# Patient Record
Sex: Female | Born: 1955
Health system: Southern US, Community
[De-identification: ages and names within clinical notes are randomized; demographics above are authoritative.]

## PROBLEM LIST (undated history)

## (undated) DIAGNOSIS — F419 Anxiety disorder, unspecified: Secondary | ICD-10-CM

## (undated) DIAGNOSIS — F410 Panic disorder [episodic paroxysmal anxiety] without agoraphobia: Secondary | ICD-10-CM

## (undated) DIAGNOSIS — E785 Hyperlipidemia, unspecified: Secondary | ICD-10-CM

## (undated) DIAGNOSIS — K219 Gastro-esophageal reflux disease without esophagitis: Secondary | ICD-10-CM

## (undated) DIAGNOSIS — B019 Varicella without complication: Secondary | ICD-10-CM

## (undated) HISTORY — DX: Varicella without complication: B01.9

## (undated) HISTORY — DX: Gastro-esophageal reflux disease without esophagitis: K21.9

## (undated) HISTORY — PX: WISDOM TOOTH EXTRACTION: SHX21

## (undated) HISTORY — PX: TONSILLECTOMY: SUR1361

## (undated) HISTORY — PX: APPENDECTOMY: SHX54

## (undated) HISTORY — DX: Panic disorder (episodic paroxysmal anxiety): F41.0

## (undated) HISTORY — DX: Hyperlipidemia, unspecified: E78.5

---

## 1998-08-10 ENCOUNTER — Encounter: Payer: Self-pay | Admitting: Oral and Maxillofacial Surgery

## 1998-08-12 ENCOUNTER — Observation Stay (HOSPITAL_COMMUNITY): Admission: RE | Admit: 1998-08-12 | Discharge: 1998-08-13 | Payer: Self-pay | Admitting: Oral and Maxillofacial Surgery

## 1999-12-21 ENCOUNTER — Other Ambulatory Visit: Admission: RE | Admit: 1999-12-21 | Discharge: 1999-12-21 | Payer: Self-pay | Admitting: Obstetrics and Gynecology

## 2001-04-06 ENCOUNTER — Other Ambulatory Visit: Admission: RE | Admit: 2001-04-06 | Discharge: 2001-04-06 | Payer: Self-pay | Admitting: Obstetrics and Gynecology

## 2004-06-07 ENCOUNTER — Other Ambulatory Visit: Admission: RE | Admit: 2004-06-07 | Discharge: 2004-06-07 | Payer: Self-pay | Admitting: *Deleted

## 2006-04-10 ENCOUNTER — Other Ambulatory Visit: Admission: RE | Admit: 2006-04-10 | Discharge: 2006-04-10 | Payer: Self-pay | Admitting: Obstetrics and Gynecology

## 2007-06-13 ENCOUNTER — Other Ambulatory Visit: Admission: RE | Admit: 2007-06-13 | Discharge: 2007-06-13 | Payer: Self-pay | Admitting: Obstetrics & Gynecology

## 2009-05-03 DIAGNOSIS — G2581 Restless legs syndrome: Secondary | ICD-10-CM | POA: Insufficient documentation

## 2012-06-25 LAB — HM DEXA SCAN: HM Dexa Scan: NORMAL

## 2012-06-25 LAB — HM PAP SMEAR: HM Pap smear: NEGATIVE

## 2012-06-25 LAB — HM MAMMOGRAPHY: HM Mammogram: NEGATIVE

## 2013-07-01 ENCOUNTER — Ambulatory Visit: Payer: Self-pay | Admitting: Nurse Practitioner

## 2013-08-13 ENCOUNTER — Ambulatory Visit: Payer: Self-pay | Admitting: Nurse Practitioner

## 2013-09-19 ENCOUNTER — Encounter: Payer: Self-pay | Admitting: Nurse Practitioner

## 2013-09-20 ENCOUNTER — Ambulatory Visit (INDEPENDENT_AMBULATORY_CARE_PROVIDER_SITE_OTHER): Payer: BC Managed Care – PPO | Admitting: Nurse Practitioner

## 2013-09-20 ENCOUNTER — Encounter: Payer: Self-pay | Admitting: Nurse Practitioner

## 2013-09-20 VITALS — BP 108/64 | HR 80 | Ht 63.75 in | Wt 158.0 lb

## 2013-09-20 DIAGNOSIS — F4001 Agoraphobia with panic disorder: Secondary | ICD-10-CM

## 2013-09-20 DIAGNOSIS — Z0000 Encounter for general adult medical examination without abnormal findings: Secondary | ICD-10-CM

## 2013-09-20 DIAGNOSIS — Z01419 Encounter for gynecological examination (general) (routine) without abnormal findings: Secondary | ICD-10-CM

## 2013-09-20 DIAGNOSIS — F410 Panic disorder [episodic paroxysmal anxiety] without agoraphobia: Secondary | ICD-10-CM | POA: Insufficient documentation

## 2013-09-20 DIAGNOSIS — Z1211 Encounter for screening for malignant neoplasm of colon: Secondary | ICD-10-CM

## 2013-09-20 DIAGNOSIS — Z23 Encounter for immunization: Secondary | ICD-10-CM

## 2013-09-20 LAB — POCT URINALYSIS DIPSTICK
Bilirubin, UA: NEGATIVE
Blood, UA: NEGATIVE
Glucose, UA: NEGATIVE
Ketones, UA: NEGATIVE
Leukocytes, UA: NEGATIVE
Nitrite, UA: NEGATIVE
Protein, UA: NEGATIVE
Urobilinogen, UA: NEGATIVE
pH, UA: 7

## 2013-09-20 NOTE — Progress Notes (Signed)
Patient ID: Deanna Peters, female   DOB: Aug 08, 1955, 58 y.o.   MRN: [id number] 58 y.o. G43P2002 Married Caucasian Fe here for annual exam.  Feels well. No pain with SA.  No new health concerns.  No vaso symptoms.  Patient's last menstrual period was 11/29/2005.          Sexually active: yes  The current method of family planning is post menopausal status.    Exercising: yes  Gym/ health club routine includes cardio, low impact aerobics, yoga and plays tennis. Smoker:  no  Health Maintenance: Pap:  06/25/12, WNL, neg HR HPV MMG:  06/25/12, Bi-Rads 1: negative Colonoscopy:  never BMD:  06/25/12, -1.0/-0.7R/-0.6R TDaP:  UTD Labs:  PCP Urine: negative    reports that she has never smoked. She has never used smokeless tobacco. She reports that she drinks alcohol. She reports that she does not use illicit drugs.  Past Medical History  Diagnosis Date  . Hyperlipidemia   . Agoraphobia with panic disorder     Past Surgical History  Procedure Laterality Date  . Wisdom tooth extraction  age 58    Current Outpatient Prescriptions  Medication Sig Dispense Refill  . buPROPion (WELLBUTRIN XL) 300 MG 24 hr tablet Take 300 mg by mouth daily.      . sertraline (ZOLOFT) 50 MG tablet Take 75 mg by mouth daily.       No current facility-administered medications for this visit.    Family History  Problem Relation Age of Onset  . Hyperlipidemia Mother   . Heart disease Father   . Heart disease Paternal Grandfather     ROS:  Pertinent items are noted in HPI.  Otherwise, a comprehensive ROS was negative.  Exam:   BP 108/64  Pulse 80  Ht 5' 3.75" (1.619 m)  Wt 158 lb (71.668 kg)  BMI 27.34 kg/m2  LMP 11/29/2005 Height: 5' 3.75" (161.9 cm)  Ht Readings from Last 3 Encounters:  09/20/13 5' 3.75" (1.619 m)    General appearance: alert, cooperative and appears stated age Head: Normocephalic, without obvious abnormality, atraumatic Neck: no adenopathy, supple, symmetrical, trachea midline  and thyroid normal to inspection and palpation Lungs: clear to auscultation bilaterally Breasts: normal appearance, no masses or tenderness Heart: regular rate and rhythm Abdomen: soft, non-tender; no masses,  no organomegaly Extremities: extremities normal, atraumatic, no cyanosis or edema Skin: Skin color, texture, turgor normal. No rashes or lesions Lymph nodes: Cervical, supraclavicular, and axillary nodes normal. No abnormal inguinal nodes palpated Neurologic: Grossly normal   Pelvic: External genitalia:  no lesions              Urethra:  normal appearing urethra with no masses, tenderness or lesions              Bartholin's and Skene's: normal                 Vagina: normal appearing vagina with normal color and discharge, no lesions              Cervix: anteverted              Pap taken: no Bimanual Exam:  Uterus:  normal size, contour, position, consistency, mobility, non-tender              Adnexa: no mass, fullness, tenderness               Rectovaginal: Confirms               Anus:  normal sphincter tone, no lesions  A:  Well Woman with normal exam  Postmenopausal - no HRT  History of high cholesterol and is seeing nutritionist  Immunization  Update  History of Agoraphobia/ panic disorder  P:   Pap smear as per guidelines   Mammogram due now and will schedule  TDaP given today  Will return in about 3 months for repeat labs  If elevated she will need to see PCP - since she does not have one, made recommendations for her.  She has finally decided on doing colonoscopy and referral made to Dr. Loreta AveMann.  Counseled on breast self exam, mammography screening, adequate intake of calcium and vitamin D, diet and exercise, Kegel's exercises return annually or prn  An After Visit Summary was printed and given to the patient.

## 2013-09-20 NOTE — Patient Instructions (Addendum)

## 2013-09-22 NOTE — Progress Notes (Signed)
Encounter reviewed by Dr. Brook Silva.  

## 2013-09-23 ENCOUNTER — Other Ambulatory Visit: Payer: Self-pay | Admitting: Nurse Practitioner

## 2013-09-23 DIAGNOSIS — Z7689 Persons encountering health services in other specified circumstances: Secondary | ICD-10-CM

## 2013-10-03 ENCOUNTER — Telehealth: Payer: Self-pay | Admitting: Nurse Practitioner

## 2013-10-03 NOTE — Telephone Encounter (Signed)
Home # rings busy Cell phone rings, but there is no voicemail option available. Patient is scheduled with Dr Loreta AveMann 03.24.2015 @ 215. [facility]

## 2013-10-30 ENCOUNTER — Emergency Department (HOSPITAL_COMMUNITY): Payer: BC Managed Care – PPO

## 2013-10-30 ENCOUNTER — Emergency Department (HOSPITAL_COMMUNITY): Payer: BC Managed Care – PPO | Admitting: Anesthesiology

## 2013-10-30 ENCOUNTER — Encounter (HOSPITAL_COMMUNITY)
Admission: EM | Disposition: A | Discharge status: Home / Self Care | Payer: Self-pay | Source: Home / Self Care | Attending: Emergency Medicine

## 2013-10-30 ENCOUNTER — Encounter (HOSPITAL_COMMUNITY): Payer: Self-pay | Admitting: Emergency Medicine

## 2013-10-30 ENCOUNTER — Observation Stay (HOSPITAL_COMMUNITY)
Admission: EM | Admit: 2013-10-30 | Discharge: 2013-10-31 | Disposition: A | Discharge status: Home / Self Care | Payer: BC Managed Care – PPO | Attending: General Surgery | Admitting: General Surgery

## 2013-10-30 ENCOUNTER — Encounter (HOSPITAL_COMMUNITY): Payer: BC Managed Care – PPO | Admitting: Anesthesiology

## 2013-10-30 DIAGNOSIS — K37 Unspecified appendicitis: Secondary | ICD-10-CM

## 2013-10-30 DIAGNOSIS — K3580 Unspecified acute appendicitis: Secondary | ICD-10-CM

## 2013-10-30 DIAGNOSIS — F410 Panic disorder [episodic paroxysmal anxiety] without agoraphobia: Secondary | ICD-10-CM | POA: Insufficient documentation

## 2013-10-30 DIAGNOSIS — K8020 Calculus of gallbladder without cholecystitis without obstruction: Secondary | ICD-10-CM | POA: Insufficient documentation

## 2013-10-30 DIAGNOSIS — E785 Hyperlipidemia, unspecified: Secondary | ICD-10-CM | POA: Insufficient documentation

## 2013-10-30 DIAGNOSIS — R1031 Right lower quadrant pain: Secondary | ICD-10-CM | POA: Insufficient documentation

## 2013-10-30 DIAGNOSIS — R109 Unspecified abdominal pain: Secondary | ICD-10-CM

## 2013-10-30 DIAGNOSIS — D72829 Elevated white blood cell count, unspecified: Secondary | ICD-10-CM | POA: Insufficient documentation

## 2013-10-30 HISTORY — PX: LAPAROSCOPIC APPENDECTOMY: SHX408

## 2013-10-30 LAB — COMPREHENSIVE METABOLIC PANEL
ALBUMIN: 4.3 g/dL (ref 3.5–5.2)
ALT: 23 U/L (ref 0–35)
AST: 17 U/L (ref 0–37)
Alkaline Phosphatase: 85 U/L (ref 39–117)
BUN: 12 mg/dL (ref 6–23)
CO2: 26 mEq/L (ref 19–32)
CREATININE: 0.87 mg/dL (ref 0.50–1.10)
Calcium: 10.4 mg/dL (ref 8.4–10.5)
Chloride: 99 mEq/L (ref 96–112)
GFR calc Af Amer: 83 mL/min — ABNORMAL LOW (ref >90)
GFR calc non Af Amer: 72 mL/min — ABNORMAL LOW (ref >90)
Glucose, Bld: 126 mg/dL — ABNORMAL HIGH (ref 70–99)
Potassium: 3.9 mEq/L (ref 3.7–5.3)
Sodium: 137 mEq/L (ref 137–147)
Total Bilirubin: 0.9 mg/dL (ref 0.3–1.2)
Total Protein: 8.1 g/dL (ref 6.0–8.3)

## 2013-10-30 LAB — URINALYSIS, ROUTINE W REFLEX MICROSCOPIC
GLUCOSE, UA: NEGATIVE mg/dL
Ketones, ur: NEGATIVE mg/dL
Nitrite: NEGATIVE
Protein, ur: NEGATIVE mg/dL
Specific Gravity, Urine: 1.03 (ref 1.005–1.030)
Urobilinogen, UA: 0.2 mg/dL (ref 0.0–1.0)
pH: 6 (ref 5.0–8.0)

## 2013-10-30 LAB — CBC WITH DIFFERENTIAL/PLATELET
BASOS PCT: 0 % (ref 0–1)
Basophils Absolute: 0 10*3/uL (ref 0.0–0.1)
EOS PCT: 0 % (ref 0–5)
Eosinophils Absolute: 0 10*3/uL (ref 0.0–0.7)
HEMATOCRIT: 45.2 % (ref 36.0–46.0)
Hemoglobin: 15.6 g/dL — ABNORMAL HIGH (ref 12.0–15.0)
Lymphocytes Relative: 9 % — ABNORMAL LOW (ref 12–46)
Lymphs Abs: 1.3 10*3/uL (ref 0.7–4.0)
MCH: 30.8 pg (ref 26.0–34.0)
MCHC: 34.5 g/dL (ref 30.0–36.0)
MCV: 89.2 fL (ref 78.0–100.0)
MONO ABS: 1.2 10*3/uL — AB (ref 0.1–1.0)
Monocytes Relative: 8 % (ref 3–12)
Neutro Abs: 12.6 10*3/uL — ABNORMAL HIGH (ref 1.7–7.7)
Neutrophils Relative %: 83 % — ABNORMAL HIGH (ref 43–77)
Platelets: 254 10*3/uL (ref 150–400)
RBC: 5.07 MIL/uL (ref 3.87–5.11)
RDW: 12.7 % (ref 11.5–15.5)
WBC: 15.2 10*3/uL — ABNORMAL HIGH (ref 4.0–10.5)

## 2013-10-30 LAB — URINE MICROSCOPIC-ADD ON

## 2013-10-30 SURGERY — APPENDECTOMY, LAPAROSCOPIC
Anesthesia: General | Site: Abdomen

## 2013-10-30 MED ORDER — IOHEXOL 300 MG/ML  SOLN
25.0000 mL | Freq: Once | INTRAMUSCULAR | Status: AC | PRN
  Administered 2013-10-30: 25 mL via ORAL

## 2013-10-30 MED ORDER — SERTRALINE HCL 50 MG PO TABS
75.0000 mg | ORAL_TABLET | Freq: Every day | ORAL | Status: DC
  Administered 2013-10-31: 75 mg via ORAL
  Filled 2013-10-30: qty 1

## 2013-10-30 MED ORDER — ROCURONIUM BROMIDE 100 MG/10ML IV SOLN
INTRAVENOUS | Status: AC
Start: 2013-10-30 — End: 2013-10-30
  Filled 2013-10-30: qty 1

## 2013-10-30 MED ORDER — LIDOCAINE HCL (CARDIAC) 20 MG/ML IV SOLN
INTRAVENOUS | Status: AC
  Filled 2013-10-30: qty 5

## 2013-10-30 MED ORDER — HYDROMORPHONE HCL PF 1 MG/ML IJ SOLN: 0.5000 mg | INTRAMUSCULAR | Status: DC | PRN

## 2013-10-30 MED ORDER — GLYCOPYRROLATE 0.2 MG/ML IJ SOLN
INTRAMUSCULAR | Status: DC | PRN
  Administered 2013-10-30: 0.4 mg via INTRAVENOUS

## 2013-10-30 MED ORDER — ONDANSETRON HCL 4 MG/2ML IJ SOLN: 4.0000 mg | Freq: Four times a day (QID) | INTRAMUSCULAR | Status: DC | PRN

## 2013-10-30 MED ORDER — PROPOFOL 10 MG/ML IV BOLUS
INTRAVENOUS | Status: DC | PRN
  Administered 2013-10-30: 150 mg via INTRAVENOUS

## 2013-10-30 MED ORDER — ONDANSETRON HCL 4 MG/2ML IJ SOLN
INTRAMUSCULAR | Status: DC | PRN
  Administered 2013-10-30: 4 mg via INTRAVENOUS

## 2013-10-30 MED ORDER — FENTANYL CITRATE 0.05 MG/ML IJ SOLN
INTRAMUSCULAR | Status: AC
  Filled 2013-10-30: qty 5

## 2013-10-30 MED ORDER — KETOROLAC TROMETHAMINE 30 MG/ML IJ SOLN
INTRAMUSCULAR | Status: AC
  Filled 2013-10-30: qty 1

## 2013-10-30 MED ORDER — ONDANSETRON HCL 4 MG/2ML IJ SOLN
4.0000 mg | Freq: Once | INTRAMUSCULAR | Status: AC
  Administered 2013-10-30: 4 mg via INTRAVENOUS
  Filled 2013-10-30: qty 2

## 2013-10-30 MED ORDER — BUPIVACAINE-EPINEPHRINE PF 0.5-1:200000 % IJ SOLN
INTRAMUSCULAR | Status: AC
  Filled 2013-10-30: qty 30

## 2013-10-30 MED ORDER — FENTANYL CITRATE 0.05 MG/ML IJ SOLN
INTRAMUSCULAR | Status: DC | PRN
  Administered 2013-10-30 (×3): 50 ug via INTRAVENOUS
  Administered 2013-10-30: 100 ug via INTRAVENOUS

## 2013-10-30 MED ORDER — LACTATED RINGERS IV SOLN: INTRAVENOUS | Status: DC

## 2013-10-30 MED ORDER — HEPARIN SODIUM (PORCINE) 5000 UNIT/ML IJ SOLN
5000.0000 [IU] | Freq: Three times a day (TID) | INTRAMUSCULAR | Status: DC
  Administered 2013-10-30 – 2013-10-31 (×2): 5000 [IU] via SUBCUTANEOUS
  Filled 2013-10-30 (×5): qty 1

## 2013-10-30 MED ORDER — KETOROLAC TROMETHAMINE 30 MG/ML IJ SOLN
15.0000 mg | Freq: Once | INTRAMUSCULAR | Status: AC | PRN
  Administered 2013-10-30: 30 mg via INTRAVENOUS

## 2013-10-30 MED ORDER — MIDAZOLAM HCL 5 MG/5ML IJ SOLN
INTRAMUSCULAR | Status: DC | PRN
  Administered 2013-10-30: 2 mg via INTRAVENOUS

## 2013-10-30 MED ORDER — PIPERACILLIN-TAZOBACTAM 3.375 G IVPB
3.3750 g | Freq: Three times a day (TID) | INTRAVENOUS | Status: DC
Start: 2013-10-30 — End: 2013-10-30
  Administered 2013-10-30: 3.375 g via INTRAVENOUS
  Filled 2013-10-30 (×3): qty 50

## 2013-10-30 MED ORDER — NEOSTIGMINE METHYLSULFATE 1 MG/ML IJ SOLN
INTRAMUSCULAR | Status: DC | PRN
  Administered 2013-10-30: 3 mg via INTRAVENOUS

## 2013-10-30 MED ORDER — DEXAMETHASONE SODIUM PHOSPHATE 10 MG/ML IJ SOLN
INTRAMUSCULAR | Status: DC | PRN
  Administered 2013-10-30: 10 mg via INTRAVENOUS

## 2013-10-30 MED ORDER — SODIUM CHLORIDE 0.9 % IV SOLN: 1000.0000 mL | INTRAVENOUS | Status: DC

## 2013-10-30 MED ORDER — BUPIVACAINE-EPINEPHRINE 0.5% -1:200000 IJ SOLN
INTRAMUSCULAR | Status: DC | PRN
  Administered 2013-10-30: 15 mL

## 2013-10-30 MED ORDER — MIDAZOLAM HCL 2 MG/2ML IJ SOLN
INTRAMUSCULAR | Status: AC
  Filled 2013-10-30: qty 2

## 2013-10-30 MED ORDER — LACTATED RINGERS IV SOLN
INTRAVENOUS | Status: DC | PRN
  Administered 2013-10-30: 18:00:00 via INTRAVENOUS

## 2013-10-30 MED ORDER — BUPIVACAINE-EPINEPHRINE 0.25% -1:200000 IJ SOLN
INTRAMUSCULAR | Status: AC
Start: 2013-10-30 — End: 2013-10-30
  Filled 2013-10-30: qty 1

## 2013-10-30 MED ORDER — IOHEXOL 300 MG/ML  SOLN
100.0000 mL | Freq: Once | INTRAMUSCULAR | Status: AC | PRN
  Administered 2013-10-30: 100 mL via INTRAVENOUS

## 2013-10-30 MED ORDER — DEXTROSE IN LACTATED RINGERS 5 % IV SOLN
INTRAVENOUS | Status: DC
  Administered 2013-10-30: 20:00:00 via INTRAVENOUS

## 2013-10-30 MED ORDER — GLYCOPYRROLATE 0.2 MG/ML IJ SOLN
INTRAMUSCULAR | Status: AC
  Filled 2013-10-30: qty 2

## 2013-10-30 MED ORDER — MORPHINE SULFATE 4 MG/ML IJ SOLN
6.0000 mg | Freq: Once | INTRAMUSCULAR | Status: AC
  Administered 2013-10-30: 6 mg via INTRAVENOUS
  Filled 2013-10-30: qty 2

## 2013-10-30 MED ORDER — HYDROMORPHONE HCL PF 1 MG/ML IJ SOLN: 1.0000 mg | INTRAMUSCULAR | Status: DC | PRN

## 2013-10-30 MED ORDER — SUCCINYLCHOLINE CHLORIDE 20 MG/ML IJ SOLN
INTRAMUSCULAR | Status: DC | PRN
  Administered 2013-10-30: 100 mg via INTRAVENOUS

## 2013-10-30 MED ORDER — HYDROMORPHONE HCL PF 1 MG/ML IJ SOLN: 0.2500 mg | INTRAMUSCULAR | Status: DC | PRN

## 2013-10-30 MED ORDER — ROCURONIUM BROMIDE 100 MG/10ML IV SOLN
INTRAVENOUS | Status: DC | PRN
  Administered 2013-10-30: 30 mg via INTRAVENOUS

## 2013-10-30 MED ORDER — PROMETHAZINE HCL 25 MG/ML IJ SOLN: 6.2500 mg | INTRAMUSCULAR | Status: DC | PRN

## 2013-10-30 MED ORDER — OXYCODONE-ACETAMINOPHEN 5-325 MG PO TABS: 1.0000 | ORAL_TABLET | ORAL | Status: DC | PRN

## 2013-10-30 MED ORDER — PIPERACILLIN-TAZOBACTAM 3.375 G IVPB 30 MIN
3.3750 g | Freq: Three times a day (TID) | INTRAVENOUS | Status: AC
  Administered 2013-10-31: 3.375 g via INTRAVENOUS
  Filled 2013-10-30: qty 50

## 2013-10-30 MED ORDER — ONDANSETRON HCL 4 MG/2ML IJ SOLN
INTRAMUSCULAR | Status: AC
  Filled 2013-10-30: qty 2

## 2013-10-30 MED ORDER — SODIUM CHLORIDE 0.9 % IV SOLN
1000.0000 mL | Freq: Once | INTRAVENOUS | Status: AC
  Administered 2013-10-30: 1000 mL via INTRAVENOUS

## 2013-10-30 MED ORDER — ONDANSETRON HCL 4 MG PO TABS: 4.0000 mg | ORAL_TABLET | Freq: Four times a day (QID) | ORAL | Status: DC | PRN

## 2013-10-30 MED ORDER — LIDOCAINE HCL (PF) 2 % IJ SOLN
INTRAMUSCULAR | Status: DC | PRN
  Administered 2013-10-30: 40 mg via INTRADERMAL

## 2013-10-30 MED ORDER — BUPROPION HCL ER (XL) 300 MG PO TB24
300.0000 mg | ORAL_TABLET | Freq: Every day | ORAL | Status: DC
  Administered 2013-10-31: 300 mg via ORAL
  Filled 2013-10-30: qty 1

## 2013-10-30 MED ORDER — DEXAMETHASONE SODIUM PHOSPHATE 10 MG/ML IJ SOLN
INTRAMUSCULAR | Status: AC
Start: 2013-10-30 — End: 2013-10-30
  Filled 2013-10-30: qty 1

## 2013-10-30 MED ORDER — PROPOFOL 10 MG/ML IV BOLUS
INTRAVENOUS | Status: AC
  Filled 2013-10-30: qty 20

## 2013-10-30 SURGICAL SUPPLY — 44 items
ADH SKN CLS APL DERMABOND .7 (GAUZE/BANDAGES/DRESSINGS) ×1
APPLIER CLIP 5 13 M/L LIGAMAX5 (MISCELLANEOUS)
APPLIER CLIP ROT 10 11.4 M/L (STAPLE) ×2
APR CLP MED LRG 11.4X10 (STAPLE) ×1
APR CLP MED LRG 5 ANG JAW (MISCELLANEOUS)
BAG SPEC RTRVL LRG 6X4 10 (ENDOMECHANICALS) ×1
CANISTER SUCTION 2500CC (MISCELLANEOUS) ×2 IMPLANT
CHLORAPREP W/TINT 26ML (MISCELLANEOUS) ×2 IMPLANT
CLIP APPLIE 5 13 M/L LIGAMAX5 (MISCELLANEOUS) IMPLANT
CLIP APPLIE ROT 10 11.4 M/L (STAPLE) ×1 IMPLANT
CUTTER FLEX LINEAR 45M (STAPLE) IMPLANT
DECANTER SPIKE VIAL GLASS SM (MISCELLANEOUS) ×2 IMPLANT
DERMABOND ADVANCED (GAUZE/BANDAGES/DRESSINGS) ×1
DERMABOND ADVANCED .7 DNX12 (GAUZE/BANDAGES/DRESSINGS) IMPLANT
DRAPE LAPAROSCOPIC ABDOMINAL (DRAPES) ×2 IMPLANT
ELECT REM PT RETURN 9FT ADLT (ELECTROSURGICAL) ×2
ELECTRODE REM PT RTRN 9FT ADLT (ELECTROSURGICAL) ×1 IMPLANT
GLOVE BIOGEL PI IND STRL 7.0 (GLOVE) ×1 IMPLANT
GLOVE BIOGEL PI INDICATOR 7.0 (GLOVE) ×1
GLOVE SS BIOGEL STRL SZ 7.5 (GLOVE) ×1 IMPLANT
GLOVE SUPERSENSE BIOGEL SZ 7.5 (GLOVE) ×1
GOWN STRL REUS W/TWL LRG LVL3 (GOWN DISPOSABLE) ×2 IMPLANT
GOWN STRL REUS W/TWL XL LVL3 (GOWN DISPOSABLE) ×4 IMPLANT
IV LACTATED RINGERS 1000ML (IV SOLUTION) ×2 IMPLANT
KIT BASIN OR (CUSTOM PROCEDURE TRAY) ×2 IMPLANT
NS IRRIG 1000ML POUR BTL (IV SOLUTION) ×2 IMPLANT
PENCIL BUTTON HOLSTER BLD 10FT (ELECTRODE) ×2 IMPLANT
POUCH SPECIMEN RETRIEVAL 10MM (ENDOMECHANICALS) ×2 IMPLANT
RELOAD 45 VASCULAR/THIN (ENDOMECHANICALS) IMPLANT
RELOAD STAPLE 45 2.5 WHT GRN (ENDOMECHANICALS) IMPLANT
RELOAD STAPLE 45 3.5 BLU ETS (ENDOMECHANICALS) IMPLANT
RELOAD STAPLE TA45 3.5 REG BLU (ENDOMECHANICALS) IMPLANT
SCALPEL HARMONIC ACE (MISCELLANEOUS) ×2 IMPLANT
SET IRRIG TUBING LAPAROSCOPIC (IRRIGATION / IRRIGATOR) ×2 IMPLANT
SOLUTION ANTI FOG 6CC (MISCELLANEOUS) ×2 IMPLANT
STRIP CLOSURE SKIN 1/2X4 (GAUZE/BANDAGES/DRESSINGS) ×2 IMPLANT
SUT MNCRL AB 4-0 PS2 18 (SUTURE) ×2 IMPLANT
TOWEL OR 17X26 10 PK STRL BLUE (TOWEL DISPOSABLE) ×2 IMPLANT
TRAY FOLEY CATH 14FRSI W/METER (CATHETERS) ×2 IMPLANT
TRAY LAP CHOLE (CUSTOM PROCEDURE TRAY) ×2 IMPLANT
TROCAR BLADELESS OPT 5 75 (ENDOMECHANICALS) ×2 IMPLANT
TROCAR XCEL 12X100 BLDLESS (ENDOMECHANICALS) ×2 IMPLANT
TROCAR XCEL BLUNT TIP 100MML (ENDOMECHANICALS) ×2 IMPLANT
TUBING INSUFFLATION 10FT LAP (TUBING) ×2 IMPLANT

## 2013-10-30 NOTE — ED Notes (Signed)
Pain started last night.  Pt is nauseated.  States pain started in middle of abdomen and it has now moved to right lower abdomen.  Pt states no difficulty urinating and no hx of stones.

## 2013-10-30 NOTE — ED Notes (Signed)
All pt belongings sent with family member. Pt to OR.

## 2013-10-30 NOTE — Progress Notes (Signed)
   CARE MANAGEMENT ED NOTE 10/30/2013  Patient:  Deanna Peters,Deanna Peters   Account Number:  [id number]  Date Initiated:  10/30/2013  Documentation initiated by:  Radford PaxFERRERO,Arye Weyenberg  Subjective/Objective Assessment:   Patient presents to ED with abdominal pain and nausea.     Subjective/Objective Assessment Detail:     Action/Plan:   Action/Plan Detail:   Anticipated DC Date:       Status Recommendation to Physician:   Result of Recommendation:    Other ED Services  Consult Working Plan    DC Planning Services  Other  PCP issues    Choice offered to / List presented to:            Status of service:  Completed, signed off  ED Comments:   ED Comments Detail:  EDCM spoke to patient at bedside.  Paient confirms she does not have a pcp, although she is in the process of "getting one."  [facility]  Patient reports she sees the NP.  EDCM instructed patient to call the phone number on the back of her insurance card or go to [employer] website to help her find a pcp who is close to her and within network. Patient verbalized understanding.  No furhter EDCm needs at this time.

## 2013-10-30 NOTE — Op Note (Signed)
Preoperative Diagnosis: Appendicitis [541]  Postoprative Diagnosis: Appendicitis [541]  Procedure: Procedure(s): APPENDECTOMY LAPAROSCOPIC   Surgeon: Glenna FellowsHoxworth, Dajohn Ellender T   Assistants: None  Anesthesia:  General endotracheal - Double lumen tube  Indications: patient is a generally healthy 58 year old female who presents with about 24 hours of progressive right lower quadrant abdominal pain. She has elevated white count and CT scan has confirmed evidence of acute appendicitis without apparent perforation or other complication. We have recommended proceeding with laparoscopic appendectomy. I discussed the indications for the surgery its nature and recovery and risks of anesthetic complications, bleeding, infection with the patient and her husband and they agree to proceed.    Procedure Detail: patient was brought to the operating room, placed in the supine position on the operating table, and general endotracheal anesthesia induced. She received preoperative IV antibiotics and PAS were in place. Foley catheter was placed. The abdomen was widely sterilely prepped and draped. The patient timeout was performed and correct procedure verified. Trocar sites were infiltrated with local anesthesia. Access was obtained with a 1 cm incision beneath the umbilicus with an open Hassan technique through a mattress suture of 0 Vicryl without difficulty and pneumoperitoneum established. Under direct vision a 5 mm trocar was placed in the right upper quadrant and a 12 mm trocar in the left lower quadrant. There was no evidence of diffuse peritonitis or peritoneal fluid. The omentum was adherent over the appendix in the right lower quadrant and was carefully taken down with blunt dissection revealing an acutely inflamed appendix with exudate but no evidence of perforation or gangrene. Lateral peritoneal attachments were divided mobilizing the cecum and appendix. The mesial appendix was then sequentially divided with  the Harmonic scalpel completely freeing the appendix down to the tip of the cecum. The appendix was then divided at the tip of the cecum with a single firing of the Endo GIA 45 mm white load stapler. The staple line was intact and without bleeding and the tissue healthy. The appendix was placed in an Endo Catch bag and brought out through the umbilical incision. The abdomen was thoroughly irrigated with saline until clear and hemostasis obtained. There was no evidence of bleeding or other problems. All CO2 was evacuated and trochars removed. The mattress suture was secured and the umbilicus. Skin incisions were closed with subcuticular Monocryl and Dermabond. Sponge needle and instrument counts were correct.    Findings: Acute suppurative appendicitis without perforation or gangrene  Estimated Blood Loss:  Minimal         Drains: none  Blood Given: none          Specimens: appendix        Complications:  * No complications entered in OR log *         Disposition: PACU - hemodynamically stable.         Condition: stable

## 2013-10-30 NOTE — ED Provider Notes (Signed)
CSN: [id number]     Arrival date & time 10/30/13  1311 History   First MD Initiated Contact with Patient 10/30/13 1442     Chief Complaint  Patient presents with  . Abdominal Pain     The history is provided by the patient.   patient reports worsening umbilical abdominal pain that began last night it is worsened through the night and into today.  Her pain is now localized in the right lower quadrant.  She's had nausea and chills.  She denies chest pain or shortness of breath.  No urinary or vaginal complaints.  No diarrhea.  She's never had pain like this before.  Past Medical History  Diagnosis Date  . Hyperlipidemia   . Agoraphobia with panic disorder    Past Surgical History  Procedure Laterality Date  . Wisdom tooth extraction  age 58   Family History  Problem Relation Age of Onset  . Hyperlipidemia Mother   . Heart disease Father   . Heart disease Paternal Grandfather    History  Substance Use Topics  . Smoking status: Never Smoker   . Smokeless tobacco: Never Used  . Alcohol Use: Yes     Comment: rare wine   OB History   Grav Para Term Preterm Abortions TAB SAB Ect Mult Living   2 2 2       2      Review of Systems  Gastrointestinal: Positive for abdominal pain.  All other systems reviewed and are negative.      Allergies  Nickel and Sulfa antibiotics  Home Medications   Current Outpatient Rx  Name  Route  Sig  Dispense  Refill  . acetaminophen (TYLENOL) 500 MG tablet   Oral   Take 1,000 mg by mouth every 6 (six) hours as needed for mild pain.         Marland Kitchen. buPROPion (WELLBUTRIN XL) 300 MG 24 hr tablet   Oral   Take 300 mg by mouth daily.         . sertraline (ZOLOFT) 50 MG tablet   Oral   Take 75 mg by mouth daily.          BP 113/69  Pulse 77  Temp(Src) 98.6 F (37 C) (Oral)  Resp 14  SpO2 94%  LMP 11/29/2005 Physical Exam  Nursing note and vitals reviewed. Constitutional: She is oriented to person, place, and time. She appears  well-developed and well-nourished. No distress.  HENT:  Head: Normocephalic and atraumatic.  Eyes: EOM are normal.  Neck: Normal range of motion.  Cardiovascular: Normal rate, regular rhythm and normal heart sounds.   Pulmonary/Chest: Effort normal and breath sounds normal.  Abdominal: Soft. She exhibits no distension.  Right lower quadrant tenderness with guarding.  No rebound  Musculoskeletal: Normal range of motion.  Neurological: She is alert and oriented to person, place, and time.  Skin: Skin is warm and dry.  Psychiatric: She has a normal mood and affect. Judgment normal.    ED Course  Procedures (including critical care time) Labs Review Labs Reviewed  CBC WITH DIFFERENTIAL - Abnormal; Notable for the following:    WBC 15.2 (*)    Hemoglobin 15.6 (*)    Neutrophils Relative % 83 (*)    Neutro Abs 12.6 (*)    Lymphocytes Relative 9 (*)    Monocytes Absolute 1.2 (*)    All other components within normal limits  COMPREHENSIVE METABOLIC PANEL - Abnormal; Notable for the following:    Glucose, Bld  126 (*)    GFR calc non Af Amer 72 (*)    GFR calc Af Amer 83 (*)    All other components within normal limits  URINALYSIS, ROUTINE W REFLEX MICROSCOPIC - Abnormal; Notable for the following:    Color, Urine AMBER (*)    Hgb urine dipstick TRACE (*)    Bilirubin Urine SMALL (*)    Leukocytes, UA TRACE (*)    All other components within normal limits  URINE MICROSCOPIC-ADD ON - Abnormal; Notable for the following:    Bacteria, UA FEW (*)    All other components within normal limits   Imaging Review Ct Abdomen Pelvis W Contrast  10/30/2013   CLINICAL DATA:  Right lower quadrant abdominal pain.  EXAM: CT ABDOMEN AND PELVIS WITH CONTRAST  TECHNIQUE: Multidetector CT imaging of the abdomen and pelvis was performed using the standard protocol following bolus administration of intravenous contrast.  CONTRAST:  25mL OMNIPAQUE IOHEXOL 300 MG/ML SOLN, OMNIPAQUE IOHEXOL 300 MG/ML  SOLN  COMPARISON:  None.  FINDINGS: Clear lung bases. Normal liver, spleen, pancreas, adrenal glands, and gallbladder. No renal calculi or hydronephrosis. Unremarkable retroperitoneum. Stomach, small bowel, and colon are unremarkable.  In the right lower quadrant the appendix is enlarged and inflamed measuring 13 mm in maximum transverse diameter. There is surrounding inflammation of the fat without free intraperitoneal fluid or air. Findings consistent with acute appendicitis.  Mild vascular calcification without aneurysmal dilatation. No pelvic masses. Bladder unremarkable. Degenerative change L5-S1.  IMPRESSION: Acute appendicitis without rupture. Findings discussed with ordering provider.   Electronically Signed   By: Davonna Belling M.D.   On: 10/30/2013 16:25   I personally reviewed the imaging tests through PACS system I reviewed available ER/hospitalization records through the EMR   EKG Interpretation None      MDM   Final diagnoses:  Appendicitis    Concerning for appendicitis versus right-sided diverticulitis.  CT scan pending.  N.p.o.  Pain treated.  Nausea treated.  IV fluids.  4:31 PM Will call [facility] for admission   Lyanne Co, MD 10/30/13 [phone]

## 2013-10-30 NOTE — H&P (Signed)
Subjective:     Deanna Peters is a 58 y.o. female She presented to the emergency department complaining of worsening umbilical abdominal pain that began last might have been worsened to the light and into today. The pain is now more localized in the right lower quadrant. She complains of chills and nausea but has not vomited. No change in bowel habits. No prior history of similar pain. Denies cardiac or pulmonary disease. No difficulty urinating. No vaginal discharge. No diarrhea.  She was evaluated by Dr. Patria Maneampos.Hemoglobin 15.6, WBC 15,200. Complete metabolic panel basically normal.Urinalysis shows only a trace of bilirubin and leukocytes. CT scan of the abdomen and pelvis Shows a few tiny gallstones but no inflammation. The appendix is enlarged and inflamed with inflammatory stranding in the fat. No evidence of free air, free fluid abscess or rupture.  She is admitted admitted for surgical management of her acute appendicitis.  Patient History:  History of hyperlipidemia and panic disorder. The nose abdominal surgery. Has had wisdom tooth extraction. Her medications include Wellbutrin 300 mg daily and Zoloft 50 mg tablets, take 75 mg daily. Allergic to nickel and sulfa antibiotics.  Family history hyperlipidemia and mother, heart disease in father, heart disease in maternal grandfather.  Social history she is married. Her husband is a dentist hearing tinnitus with her today. She denies tobacco. Drinks 1 occasionally.    Review of Systems A comprehensive review of systems was negative.    Objective:    BP 113/69  Pulse 77  Temp(Src) 98.6 F (37 C) (Oral)  Resp 14  SpO2 94%  LMP 11/29/2005  General:  alert and cooperative. Mild distress. Mental status normal  Skin:  normal and skin warm and dry.  Eyes: conjunctivae/corneas clear. PERRL, EOM's intact. Fundi benign.,      Lymph Nodes:  Cervical, supraclavicular nodes normal  Lungs:  clear to auscultation bilaterally  Heart:   regular rate and rhythm, S1, S2 normal, no murmur, click, rub or gallop  Abdomen: abdomen soft. Not distended. No scars or hernias. Skin healthy. Localized tenderness and guarding in the right lower quadrant and to a lesser degree in the suprapubic area. No palpable mass.  CVA:  not performed  Genitourinary: defer exam  Extremities:  extremities normal, atraumatic, no cyanosis or edema  Neurologic:  Alert and oriented x3. Gait normal. Reflexes and motor strength normal and symmetric. Cranial nerves 2-12 and sensation grossly intact.  Psychiatric:  normal mood, behavior, speech, dress, and thought processes     Assessment:     Acute appendicitis. By physical exam and CT scan, there is no evidence of free rupture.  Hyperlipidemia  History of panic attacks, well controlled      Plan:   The patient will be started on IV fluids and IV antibiotics. She'll be taken to the operating room for a laparoscopic appendectomy, possible open appendectomy by Dr. Johna SheriffHoxworth this evening.Marland Kitchen. He is our on-call surgeon tonight. The family is very comfortable with this.  I discussed the indications, details, techniques, and numerous risks of the surgery with her tissues where the risk of bleeding, infection, conversion to open laparotomy, wound problems such as hernia, injury to adjacent organs requiring repair, cardiac, pulmonary and thromboembolic problems. She understands all these issues all her questions were answered. She agrees with this plan.    Angelia MouldHaywood M. Derrell LollingIngram, M.D., [facility] General and Minimally invasive Surgery Breast and Colorectal Surgery Office:   [phone] Pager:   [phone]

## 2013-10-30 NOTE — Anesthesia Preprocedure Evaluation (Signed)
Anesthesia Evaluation  Patient identified by MRN, date of birth, ID band Patient awake    Reviewed: Allergy & Precautions, H&P , NPO status , Patient's Chart, lab work & pertinent test results  Airway Mallampati: II  TM Distance: >3 FB Neck ROM: Full    Dental no notable dental hx.    Pulmonary neg pulmonary ROS,  breath sounds clear to auscultation  Pulmonary exam normal       Cardiovascular negative cardio ROS  Rhythm:Regular Rate:Normal     Neuro/Psych negative neurological ROS  negative psych ROS   GI/Hepatic negative GI ROS, Neg liver ROS,   Endo/Other  negative endocrine ROS  Renal/GU negative Renal ROS  negative genitourinary   Musculoskeletal negative musculoskeletal ROS (+)   Abdominal   Peds negative pediatric ROS (+)  Hematology negative hematology ROS (+)   Anesthesia Other Findings   Reproductive/Obstetrics negative OB ROS                             Anesthesia Physical Anesthesia Plan  ASA: I  Anesthesia Plan: General   Post-op Pain Management:    Induction: Intravenous and Rapid sequence  Airway Management Planned: Oral ETT  Additional Equipment:   Intra-op Plan:   Post-operative Plan: Extubation in OR  Informed Consent: I have reviewed the patients History and Physical, chart, labs and discussed the procedure including the risks, benefits and alternatives for the proposed anesthesia with the patient or authorized representative who has indicated his/her understanding and acceptance.   Dental advisory given  Plan Discussed with: CRNA and Surgeon  Anesthesia Plan Comments:         Anesthesia Quick Evaluation  

## 2013-10-30 NOTE — Transfer of Care (Signed)
Immediate Anesthesia Transfer of Care Note  Patient: Deanna Peters  Procedure(s) Performed: Procedure(s) (LRB): APPENDECTOMY LAPAROSCOPIC (N/A)  Patient Location: PACU  Anesthesia Type: General  Level of Consciousness: sedated, patient cooperative and responds to stimulation  Airway & Oxygen Therapy: Patient Spontanous Breathing and Patient connected to face mask oxgen  Post-op Assessment: Report given to PACU RN and Post -op Vital signs reviewed and stable  Post vital signs: Reviewed and stable  Complications: No apparent anesthesia complications

## 2013-10-30 NOTE — ED Notes (Signed)
Pt out of room for CT 

## 2013-10-31 ENCOUNTER — Encounter (HOSPITAL_COMMUNITY): Payer: Self-pay | Admitting: General Surgery

## 2013-10-31 LAB — CBC
HCT: 39.1 % (ref 36.0–46.0)
Hemoglobin: 12.8 g/dL (ref 12.0–15.0)
MCH: 29.6 pg (ref 26.0–34.0)
MCHC: 32.7 g/dL (ref 30.0–36.0)
MCV: 90.5 fL (ref 78.0–100.0)
Platelets: 206 10*3/uL (ref 150–400)
RBC: 4.32 MIL/uL (ref 3.87–5.11)
RDW: 12.7 % (ref 11.5–15.5)
WBC: 14.9 10*3/uL — AB (ref 4.0–10.5)

## 2013-10-31 MED ORDER — SODIUM CHLORIDE 0.9 % IV SOLN: 1000.0000 mL | INTRAVENOUS | Status: DC

## 2013-10-31 MED ORDER — OXYCODONE-ACETAMINOPHEN 5-325 MG PO TABS: 1.0000 | ORAL_TABLET | Freq: Four times a day (QID) | ORAL | Status: DC | PRN

## 2013-10-31 NOTE — Discharge Summary (Signed)
Agree with discharge summary and followup plans as outlined.  Angelia MouldHaywood M. Derrell LollingIngram, M.D., [facility] General and Minimally invasive Surgery Breast and Colorectal Surgery Office:   [phone] Pager:   [phone]

## 2013-10-31 NOTE — Discharge Instructions (Signed)
[facility] ______[facility] °LAPAROSCOPIC SURGERY: POST OP INSTRUCTIONS °Always review your discharge instruction sheet given to you by the facility where your surgery was performed. °IF YOU HAVE DISABILITY OR FAMILY LEAVE FORMS, YOU MUST BRING THEM TO THE OFFICE FOR PROCESSING.   °DO NOT GIVE THEM TO YOUR DOCTOR. ° °1. A prescription for pain medication may be given to you upon discharge.  Take your pain medication as prescribed, if needed.  If narcotic pain medicine is not needed, then you may take acetaminophen (Tylenol) or ibuprofen (Advil) as needed. °2. Take your usually prescribed medications unless otherwise directed. °3. If you need a refill on your pain medication, please contact your pharmacy.  They will contact our office to request authorization. Prescriptions will not be filled after 5pm or on week-ends. °4. You should follow a light diet the first few days after arrival home, such as soup and crackers, etc.  Be sure to include lots of fluids daily. °5. Most patients will experience some swelling and bruising in the area of the incisions.  Ice packs will help.  Swelling and bruising can take several days to resolve.  °6. It is common to experience some constipation if taking pain medication after surgery.  Increasing fluid intake and taking a stool softener (such as Colace) will usually help or prevent this problem from occurring.  A mild laxative (Milk of Magnesia or Miralax) should be taken according to package instructions if there are no bowel movements after 48 hours. °7. Unless discharge instructions indicate otherwise, you may remove your bandages 24-48 hours after surgery, and you may shower at that time.  You may have steri-strips (small skin tapes) in place directly over the incision.  These strips should be left on the skin for 7-10 days.  If your surgeon used skin glue on the incision, you may shower in 24 hours.  The glue will flake off over the next 2-3 weeks.  Any sutures or  staples will be removed at the office during your follow-up visit. °8. ACTIVITIES:  You may resume regular (light) daily activities beginning the next day--such as daily self-care, walking, climbing stairs--gradually increasing activities as tolerated.  You may have sexual intercourse when it is comfortable.  Refrain from any heavy lifting or straining until approved by your doctor. °a. You may drive when you are no longer taking prescription pain medication, you can comfortably wear a seatbelt, and you can safely maneuver your car and apply brakes. °b. RETURN TO WORK:  __________________________________________________________ °9. You should see your doctor in the office for a follow-up appointment approximately 2-3 weeks after your surgery.  Make sure that you call for this appointment within a day or two after you arrive home to insure a convenient appointment time. °10. OTHER INSTRUCTIONS: __________________________________________________________________________________________________________________________ __________________________________________________________________________________________________________________________ °WHEN TO CALL YOUR DOCTOR: °1. Fever over 101.0 °2. Inability to urinate °3. Continued bleeding from incision. °4. Increased pain, redness, or drainage from the incision. °5. Increasing abdominal pain ° °The clinic staff is available to answer your questions during regular business hours.  Please don’t hesitate to call and ask to speak to one of the nurses for clinical concerns.  If you have a medical emergency, go to the nearest emergency room or call 911.  A surgeon from [facility] is always on call at the hospital. °[address], [address], [place], [place]  [address] ? [address] [address], [place], [place]   [address] °[phone] ? [phone] ? FAX [phone] °Web site:   [email or web address] °

## 2013-10-31 NOTE — Progress Notes (Signed)
UR completed 

## 2013-10-31 NOTE — Discharge Summary (Signed)
Physician Discharge Summary  Patient ID: Deanna Peters MRN: [id number] DOB/AGE: 1956-06-05 58 y.o.  Admit date: 10/30/2013 Discharge date: 10/31/2013  Admitting Diagnosis: Acute appendicitis  Discharge Diagnosis Patient Active Problem List   Diagnosis Date Noted  . Acute appendicitis - suppurative 10/30/2013  . Panic disorder with agoraphobia 09/20/2013    Consultants None  Imaging: Ct Abdomen Pelvis W Contrast  10/30/2013   ADDENDUM REPORT: 10/30/2013 16:33  ADDENDUM: There appear to be a few tiny gallstones within the gallbladder, without signs of acute gallbladder inflammation.   Electronically Signed   By: Davonna Belling M.D.   On: 10/30/2013 16:33   10/30/2013   CLINICAL DATA:  Right lower quadrant abdominal pain.  EXAM: CT ABDOMEN AND PELVIS WITH CONTRAST  TECHNIQUE: Multidetector CT imaging of the abdomen and pelvis was performed using the standard protocol following bolus administration of intravenous contrast.  CONTRAST:  25mL OMNIPAQUE IOHEXOL 300 MG/ML SOLN, OMNIPAQUE IOHEXOL 300 MG/ML SOLN  COMPARISON:  None.  FINDINGS: Clear lung bases. Normal liver, spleen, pancreas, adrenal glands, and gallbladder. No renal calculi or hydronephrosis. Unremarkable retroperitoneum. Stomach, small bowel, and colon are unremarkable.  In the right lower quadrant the appendix is enlarged and inflamed measuring 13 mm in maximum transverse diameter. There is surrounding inflammation of the fat without free intraperitoneal fluid or air. Findings consistent with acute appendicitis.  Mild vascular calcification without aneurysmal dilatation. No pelvic masses. Bladder unremarkable. Degenerative change L5-S1.  IMPRESSION: Acute appendicitis without rupture. Findings discussed with ordering provider.  Electronically Signed: By: Davonna Belling M.D. On: 10/30/2013 16:25    Procedures Dr. Rometta Emery (10/30/13) - Laparoscopic Appendectomy  Hospital Course:  58 y.o. female presented to [facility] complaining of  worsening umbilical abdominal pain that began last might have been worsened to the night and into today (10/30/13). The pain is now more localized in the right lower quadrant. She complains of chills and nausea but has not vomited. No change in bowel habits. No prior history of similar pain. Denies cardiac or pulmonary disease. No difficulty urinating. No vaginal discharge. No diarrhea.   Hemoglobin 15.6, WBC 15,200. Complete metabolic panel basically normal.Urinalysis shows only a trace of bilirubin and leukocytes. CT scan of the abdomen and pelvis Shows a few tiny gallstones but no inflammation. The appendix is enlarged and inflamed with inflammatory stranding in the fat. No evidence of free air, free fluid abscess or rupture.   She was admitted for surgical management of her acute appendicitis.  She underwent procedure listed above.  Tolerated procedure well and was transferred to the floor.  Diet was advanced as tolerated.  On POD #1, the patient was voiding well, tolerating diet, ambulating well, pain well controlled, vital signs stable, incisions c/d/i and felt stable for discharge home.  Patient will follow up in our office in 3 weeks and knows to call with questions or concerns.  Physical Exam: General:  Alert, NAD, pleasant, comfortable Abd:  Soft, ND, mild tenderness, incisions C/D/I, dermabond in place    Medication List         acetaminophen 500 MG tablet  Commonly known as:  TYLENOL  Take 1,000 mg by mouth every 6 (six) hours as needed for mild pain.     buPROPion 300 MG 24 hr tablet  Commonly known as:  WELLBUTRIN XL  Take 300 mg by mouth daily.     oxyCODONE-acetaminophen 5-325 MG per tablet  Commonly known as:  PERCOCET/ROXICET  Take 1-2 tablets by mouth every 6 (six) hours  as needed for moderate pain.     sertraline 50 MG tablet  Commonly known as:  ZOLOFT  Take 75 mg by mouth daily.             Follow-up Information   Follow up with [facility] On  11/19/2013. (For post-operation check Your appointment is at 3:45pm, please arrive at least 30 min before your appointment to complete your check in paperwork.  If you are unable to arrive 30 min prior to your appointment time we may have to cancel or reschedule you.)    Contact information:   [address]   [place] [address] [phone]       Signed: Candiss NorseMegan Dort, PA-C [facility] [phone]  10/31/2013, 9:49 AM

## 2013-10-31 NOTE — Progress Notes (Signed)
1 Day Post-Op  Subjective: Stable postop. Underwent uneventful laparoscopic appendectomy last night or acute appendicitis. Apparently no rupture or gangrene. She seems stable this morning, and has ambulated to the bathroom. Voiding normally. Tolerating liquids and crackers. No nausea. The  Objective: Vital signs in last 24 hours: Temp:  [97.5 F (36.4 C)-99.9 F (37.7 C)] 97.5 F (36.4 C) (04/02 0012) Pulse Rate:  [71-97] 78 (04/02 0012) Resp:  [14-18] 16 (04/01 2220) BP: (89-152)/(50-72) 101/63 mmHg (04/02 0012) SpO2:  [93 %-98 %] 95 % (04/02 0300) Weight:  [158 lb (71.668 kg)] 158 lb (71.668 kg) (04/02 0300)    Intake/Output from previous day: 04/01 0701 - 04/02 0700 In: 1690 [P.O.:240; I.V.:1450] Out: 500 [Urine:500] Intake/Output this shift: Total I/O In: 1690 [P.O.:240; I.V.:1450] Out: 500 [Urine:500]    EXAM: General appearance: alert. No distress. Mental status normal. GI: soft. Incisions look good. No bleeding. No distention. Appropriate incisional tenderness, otherwise benign exam.  Lab Results:  Results for orders placed during the hospital encounter of 10/30/13 (from the past 24 hour(s))  CBC WITH DIFFERENTIAL     Status: Abnormal   Collection Time    10/30/13  1:50 PM      Result Value Ref Range   WBC 15.2 (*) 4.0 - 10.5 K/uL   RBC 5.07  3.87 - 5.11 MIL/uL   Hemoglobin 15.6 (*) 12.0 - 15.0 g/dL   HCT 16.145.2  09.636.0 - 04.546.0 %   MCV 89.2  78.0 - 100.0 fL   MCH 30.8  26.0 - 34.0 pg   MCHC 34.5  30.0 - 36.0 g/dL   RDW 40.912.7  81.111.5 - 91.415.5 %   Platelets 254  150 - 400 K/uL   Neutrophils Relative % 83 (*) 43 - 77 %   Neutro Abs 12.6 (*) 1.7 - 7.7 K/uL   Lymphocytes Relative 9 (*) 12 - 46 %   Lymphs Abs 1.3  0.7 - 4.0 K/uL   Monocytes Relative 8  3 - 12 %   Monocytes Absolute 1.2 (*) 0.1 - 1.0 K/uL   Eosinophils Relative 0  0 - 5 %   Eosinophils Absolute 0.0  0.0 - 0.7 K/uL   Basophils Relative 0  0 - 1 %   Basophils Absolute 0.0  0.0 - 0.1 K/uL  COMPREHENSIVE  METABOLIC PANEL     Status: Abnormal   Collection Time    10/30/13  1:50 PM      Result Value Ref Range   Sodium 137  137 - 147 mEq/L   Potassium 3.9  3.7 - 5.3 mEq/L   Chloride 99  96 - 112 mEq/L   CO2 26  19 - 32 mEq/L   Glucose, Bld 126 (*) 70 - 99 mg/dL   BUN 12  6 - 23 mg/dL   Creatinine, Ser 7.820.87  0.50 - 1.10 mg/dL   Calcium 95.610.4  8.4 - 21.310.5 mg/dL   Total Protein 8.1  6.0 - 8.3 g/dL   Albumin 4.3  3.5 - 5.2 g/dL   AST 17  0 - 37 U/L   ALT 23  0 - 35 U/L   Alkaline Phosphatase 85  39 - 117 U/L   Total Bilirubin 0.9  0.3 - 1.2 mg/dL   GFR calc non Af Amer 72 (*) >90 mL/min   GFR calc Af Amer 83 (*) >90 mL/min  URINALYSIS, ROUTINE W REFLEX MICROSCOPIC     Status: Abnormal   Collection Time    10/30/13  1:54 PM  Result Value Ref Range   Color, Urine AMBER (*) YELLOW   APPearance CLEAR  CLEAR   Specific Gravity, Urine 1.030  1.005 - 1.030   pH 6.0  5.0 - 8.0   Glucose, UA NEGATIVE  NEGATIVE mg/dL   Hgb urine dipstick TRACE (*) NEGATIVE   Bilirubin Urine SMALL (*) NEGATIVE   Ketones, ur NEGATIVE  NEGATIVE mg/dL   Protein, ur NEGATIVE  NEGATIVE mg/dL   Urobilinogen, UA 0.2  0.0 - 1.0 mg/dL   Nitrite NEGATIVE  NEGATIVE   Leukocytes, UA TRACE (*) NEGATIVE  URINE MICROSCOPIC-ADD ON     Status: Abnormal   Collection Time    10/30/13  1:54 PM      Result Value Ref Range   Squamous Epithelial / LPF RARE  RARE   WBC, UA 0-2  <3 WBC/hpf   RBC / HPF 3-6  <3 RBC/hpf   Bacteria, UA FEW (*) RARE   Urine-Other MUCOUS PRESENT    CBC     Status: Abnormal   Collection Time    10/31/13  4:20 AM      Result Value Ref Range   WBC 14.9 (*) 4.0 - 10.5 K/uL   RBC 4.32  3.87 - 5.11 MIL/uL   Hemoglobin 12.8  12.0 - 15.0 g/dL   HCT 78.2  95.6 - 21.3 %   MCV 90.5  78.0 - 100.0 fL   MCH 29.6  26.0 - 34.0 pg   MCHC 32.7  30.0 - 36.0 g/dL   RDW 08.6  57.8 - 46.9 %   Platelets 206  150 - 400 K/uL     Studies/Results: Ct Abdomen Pelvis W Contrast  10/30/2013   ADDENDUM REPORT:  10/30/2013 16:33  ADDENDUM: There appear to be a few tiny gallstones within the gallbladder, without signs of acute gallbladder inflammation.   Electronically Signed   By: Davonna Belling M.D.   On: 10/30/2013 16:33   10/30/2013   CLINICAL DATA:  Right lower quadrant abdominal pain.  EXAM: CT ABDOMEN AND PELVIS WITH CONTRAST  TECHNIQUE: Multidetector CT imaging of the abdomen and pelvis was performed using the standard protocol following bolus administration of intravenous contrast.  CONTRAST:  25mL OMNIPAQUE IOHEXOL 300 MG/ML SOLN, OMNIPAQUE IOHEXOL 300 MG/ML SOLN  COMPARISON:  None.  FINDINGS: Clear lung bases. Normal liver, spleen, pancreas, adrenal glands, and gallbladder. No renal calculi or hydronephrosis. Unremarkable retroperitoneum. Stomach, small bowel, and colon are unremarkable.  In the right lower quadrant the appendix is enlarged and inflamed measuring 13 mm in maximum transverse diameter. There is surrounding inflammation of the fat without free intraperitoneal fluid or air. Findings consistent with acute appendicitis.  Mild vascular calcification without aneurysmal dilatation. No pelvic masses. Bladder unremarkable. Degenerative change L5-S1.  IMPRESSION: Acute appendicitis without rupture. Findings discussed with ordering provider.  Electronically Signed: By: Davonna Belling M.D. On: 10/30/2013 16:25    . buPROPion  300 mg Oral Daily  . heparin subcutaneous  5,000 Units Subcutaneous 3 times per day  . ketorolac      . sertraline  75 mg Oral Daily     Assessment/Plan: s/p Procedure(s): APPENDECTOMY LAPAROSCOPIC  POD #1(11 hours postop.)   Laparoscopic appendectomy Stable Advance diet and activities Probable discharge later today.  @PROBHOSP @  LOS: 1 day    Margaretann Abate M 10/31/2013  . .prob

## 2013-11-11 NOTE — Anesthesia Postprocedure Evaluation (Signed)
  Anesthesia Post-op Note  Patient: Deanna Peters  Procedure(s) Performed: Procedure(s) (LRB): APPENDECTOMY LAPAROSCOPIC (N/A)  Patient Location: PACU  Anesthesia Type: General  Level of Consciousness: awake and alert   Airway and Oxygen Therapy: Patient Spontanous Breathing  Post-op Pain: mild  Post-op Assessment: Post-op Vital signs reviewed, Patient's Cardiovascular Status Stable, Respiratory Function Stable, Patent Airway and No signs of Nausea or vomiting  Last Vitals:  Filed Vitals:   10/31/13 0630  BP: 93/54  Pulse: 68  Temp: 36.7 C  Resp: 16    Post-op Vital Signs: stable   Complications: No apparent anesthesia complications

## 2013-11-19 ENCOUNTER — Encounter (INDEPENDENT_AMBULATORY_CARE_PROVIDER_SITE_OTHER): Payer: BC Managed Care – PPO | Admitting: General Surgery

## 2013-11-20 ENCOUNTER — Encounter (INDEPENDENT_AMBULATORY_CARE_PROVIDER_SITE_OTHER): Payer: Self-pay | Admitting: General Surgery

## 2013-12-19 ENCOUNTER — Other Ambulatory Visit: Payer: BC Managed Care – PPO

## 2013-12-25 ENCOUNTER — Other Ambulatory Visit (INDEPENDENT_AMBULATORY_CARE_PROVIDER_SITE_OTHER): Payer: BC Managed Care – PPO

## 2013-12-25 DIAGNOSIS — Z0000 Encounter for general adult medical examination without abnormal findings: Secondary | ICD-10-CM

## 2013-12-25 LAB — COMPREHENSIVE METABOLIC PANEL
ALK PHOS: 63 U/L (ref 39–117)
ALT: 29 U/L (ref 0–35)
AST: 22 U/L (ref 0–37)
Albumin: 4.5 g/dL (ref 3.5–5.2)
BUN: 19 mg/dL (ref 6–23)
CO2: 26 mEq/L (ref 19–32)
Calcium: 9.4 mg/dL (ref 8.4–10.5)
Chloride: 103 mEq/L (ref 96–112)
Creat: 0.81 mg/dL (ref 0.50–1.10)
GLUCOSE: 90 mg/dL (ref 70–99)
Potassium: 4.3 mEq/L (ref 3.5–5.3)
SODIUM: 137 meq/L (ref 135–145)
Total Bilirubin: 0.7 mg/dL (ref 0.2–1.2)
Total Protein: 6.5 g/dL (ref 6.0–8.3)

## 2013-12-25 LAB — LIPID PANEL
CHOL/HDL RATIO: 6.6 ratio
Cholesterol: 285 mg/dL — ABNORMAL HIGH (ref 0–200)
HDL: 43 mg/dL (ref >39)
LDL CALC: 197 mg/dL — AB (ref 0–99)
Triglycerides: 223 mg/dL — ABNORMAL HIGH (ref <150)
VLDL: 45 mg/dL — ABNORMAL HIGH (ref 0–40)

## 2013-12-25 LAB — TSH: TSH: 1.472 u[IU]/mL (ref 0.350–4.500)

## 2013-12-26 LAB — VITAMIN D 25 HYDROXY (VIT D DEFICIENCY, FRACTURES): VIT D 25 HYDROXY: 43 ng/mL (ref 30–89)

## 2014-01-08 ENCOUNTER — Telehealth: Payer: Self-pay | Admitting: Nurse Practitioner

## 2014-01-08 NOTE — Telephone Encounter (Signed)
Left message to call Mirna Sutcliffe at [phone].   Advise patient do not see that CRP was done with labs at last visit.

## 2014-01-08 NOTE — Telephone Encounter (Signed)
Pt is calling to see if she had a test done for CRP done. Pt states she did not see the results for this test in her my chart.

## 2014-01-09 ENCOUNTER — Encounter (INDEPENDENT_AMBULATORY_CARE_PROVIDER_SITE_OTHER): Payer: BC Managed Care – PPO | Admitting: Ophthalmology

## 2014-01-09 DIAGNOSIS — H2510 Age-related nuclear cataract, unspecified eye: Secondary | ICD-10-CM

## 2014-01-09 DIAGNOSIS — H43819 Vitreous degeneration, unspecified eye: Secondary | ICD-10-CM

## 2014-01-10 NOTE — Telephone Encounter (Signed)
Spoke with patient. Advised CRP was not drawn on 5/27 visit. Patient states "I asked for it to be done with my other labs I thought." Apologized to patient. Patient states that she would like to come in to have it drawn but will have to check her schedule first and will call back with availability.   Okay for patient to come in for CRP?  Routing to Dr.Silva as covering CC: Lauro FranklinPatricia Rolen-Grubb, FNP

## 2014-01-11 ENCOUNTER — Other Ambulatory Visit: Payer: Self-pay | Admitting: Obstetrics and Gynecology

## 2014-01-11 DIAGNOSIS — E785 Hyperlipidemia, unspecified: Secondary | ICD-10-CM

## 2014-01-11 NOTE — Telephone Encounter (Signed)
I will put in an order for a CRP.

## 2014-01-16 NOTE — Telephone Encounter (Signed)
Left message to call Kaitlyn at [phone]. 

## 2014-01-21 ENCOUNTER — Other Ambulatory Visit: Payer: Self-pay | Admitting: Orthopedic Surgery

## 2014-01-22 ENCOUNTER — Encounter (HOSPITAL_BASED_OUTPATIENT_CLINIC_OR_DEPARTMENT_OTHER): Payer: Self-pay | Admitting: *Deleted

## 2014-01-23 NOTE — Telephone Encounter (Signed)
Spoke with patient. Patient would like to hold off on having CRP done at this time. "I just got back from a trip and I am going on another one soon." Patient would like to call back and reschedule at a later date.  Routing to provider for final review. Patient agreeable to disposition. Will close encounter

## 2014-01-27 NOTE — H&P (Signed)
  Deanna Peters is an 58 y.o. female.   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 She has no history of cervical spine pain, radicular symptoms or prior cervical injury. She has no history of upper extremity fracture. She has no history of diabetes, thyroid disease, rheumatoid arthritis or gout. She has nocturnal numbness and dysesthesia 7 out of 7 nights a week. She has not used any splints to date.    Past Medical History  Diagnosis Date  . Hyperlipidemia   . Agoraphobia with panic disorder   . Anxiety     Past Surgical History  Procedure Laterality Date  . Wisdom tooth extraction  age 58  . Laparoscopic appendectomy N/A 10/30/2013    Procedure: APPENDECTOMY LAPAROSCOPIC;  Surgeon: Mariella SaaBenjamin T Hoxworth, MD;  Location: [facility];  Service: General;  Laterality: N/A;    Family History  Problem Relation Age of Onset  . Hyperlipidemia Mother   . Heart disease Father   . Heart disease Paternal Grandfather    Social History:  reports that she has never smoked. She has never used smokeless tobacco. She reports that she drinks alcohol. She reports that she does not use illicit drugs.  Allergies:  Allergies  Allergen Reactions  . Nickel Itching and Swelling  . Sulfa Antibiotics Itching and Rash    No prescriptions prior to admission    No results found for this or any previous visit (from the past 48 hour(s)).  No results found.   Pertinent items are noted in HPI.  Height 5\' 4"  (1.626 m), weight 68.04 kg (150 lb), last menstrual period 11/29/2005.  General appearance: alert Head: Normocephalic, without obvious abnormality Neck: supple, symmetrical, trachea midline Resp: clear to auscultation bilaterally Cardio: regular rate and rhythm GI: normal findings: bowel sounds normal Extremities:  . Inspection of her hands reveals no thenar atrophy. Her sweat patterns and dermatoglyphics are preserved. She has no sign of STS in her fingers, thumb or first dorsal compartments. Her pulses and capillary refill are intact. Her sensibility is preserved in the median, ulnar and radial distribution.   Dr. Johna RolesPelligra completed detailed electrodiagnostic studies. These revealed moderately severe right carpal tunnel syndrome with sensory amplitude 10.7 microvolts. She has mild left carpal tunnel syndrome.   Pulses: 2+ and symmetric Skin: normal Neurologic: Grossly normal    Assessment/Plan Impression: Right CTS  Plan:To the OR for right CTR.The procedure, risks,benefits and post-op course were discussed with the patient at length and they were in agreement with the plan.  DASNOIT,Deanna Peters 01/27/2014, 3:37 PM   H&P documentation: 01/28/2014  -History and Physical Reviewed  -Patient has been re-examined  -No change in the plan of care  Deanna Forsterobert V Kaitlynne Wenz Jr, MD

## 2014-01-28 ENCOUNTER — Encounter (HOSPITAL_BASED_OUTPATIENT_CLINIC_OR_DEPARTMENT_OTHER): Payer: BC Managed Care – PPO | Admitting: Anesthesiology

## 2014-01-28 ENCOUNTER — Encounter (HOSPITAL_BASED_OUTPATIENT_CLINIC_OR_DEPARTMENT_OTHER): Payer: Self-pay | Admitting: Orthopedic Surgery

## 2014-01-28 ENCOUNTER — Ambulatory Visit (HOSPITAL_BASED_OUTPATIENT_CLINIC_OR_DEPARTMENT_OTHER)
Admission: RE | Admit: 2014-01-28 | Discharge: 2014-01-28 | Disposition: A | Discharge status: Home / Self Care | Payer: BC Managed Care – PPO | Source: Ambulatory Visit | Attending: Orthopedic Surgery | Admitting: Orthopedic Surgery

## 2014-01-28 ENCOUNTER — Encounter (HOSPITAL_BASED_OUTPATIENT_CLINIC_OR_DEPARTMENT_OTHER)
Admission: RE | Disposition: A | Discharge status: Home / Self Care | Payer: Self-pay | Source: Ambulatory Visit | Attending: Orthopedic Surgery

## 2014-01-28 ENCOUNTER — Ambulatory Visit (HOSPITAL_BASED_OUTPATIENT_CLINIC_OR_DEPARTMENT_OTHER): Payer: BC Managed Care – PPO | Admitting: Anesthesiology

## 2014-01-28 DIAGNOSIS — F411 Generalized anxiety disorder: Secondary | ICD-10-CM | POA: Insufficient documentation

## 2014-01-28 DIAGNOSIS — F4001 Agoraphobia with panic disorder: Secondary | ICD-10-CM | POA: Insufficient documentation

## 2014-01-28 DIAGNOSIS — E785 Hyperlipidemia, unspecified: Secondary | ICD-10-CM | POA: Insufficient documentation

## 2014-01-28 DIAGNOSIS — G5600 Carpal tunnel syndrome, unspecified upper limb: Secondary | ICD-10-CM | POA: Insufficient documentation

## 2014-01-28 HISTORY — DX: Anxiety disorder, unspecified: F41.9

## 2014-01-28 HISTORY — PX: CARPAL TUNNEL RELEASE: SHX101

## 2014-01-28 LAB — POCT HEMOGLOBIN-HEMACUE: HEMOGLOBIN: 15.3 g/dL — AB (ref 12.0–15.0)

## 2014-01-28 SURGERY — CARPAL TUNNEL RELEASE
Anesthesia: General | Site: Wrist | Laterality: Right

## 2014-01-28 MED ORDER — MIDAZOLAM HCL 2 MG/2ML IJ SOLN: 1.0000 mg | INTRAMUSCULAR | Status: DC | PRN

## 2014-01-28 MED ORDER — ACETAMINOPHEN-CODEINE #3 300-30 MG PO TABS: 1.0000 | ORAL_TABLET | ORAL | Status: DC | PRN

## 2014-01-28 MED ORDER — CHLORHEXIDINE GLUCONATE 4 % EX LIQD: 60.0000 mL | Freq: Once | CUTANEOUS | Status: DC

## 2014-01-28 MED ORDER — FENTANYL CITRATE 0.05 MG/ML IJ SOLN
INTRAMUSCULAR | Status: DC | PRN
  Administered 2014-01-28: 100 ug via INTRAVENOUS

## 2014-01-28 MED ORDER — OXYCODONE HCL 5 MG/5ML PO SOLN: 5.0000 mg | Freq: Once | ORAL | Status: DC | PRN

## 2014-01-28 MED ORDER — LIDOCAINE HCL 2 % IJ SOLN
INTRAMUSCULAR | Status: DC | PRN
  Administered 2014-01-28: 4 mL

## 2014-01-28 MED ORDER — PROPOFOL 10 MG/ML IV BOLUS
INTRAVENOUS | Status: DC | PRN
  Administered 2014-01-28: 200 mg via INTRAVENOUS
  Administered 2014-01-28: 100 mg via INTRAVENOUS

## 2014-01-28 MED ORDER — MIDAZOLAM HCL 2 MG/2ML IJ SOLN
INTRAMUSCULAR | Status: AC
  Filled 2014-01-28: qty 2

## 2014-01-28 MED ORDER — FENTANYL CITRATE 0.05 MG/ML IJ SOLN: 50.0000 ug | INTRAMUSCULAR | Status: DC | PRN

## 2014-01-28 MED ORDER — OXYCODONE HCL 5 MG PO TABS: 5.0000 mg | ORAL_TABLET | Freq: Once | ORAL | Status: DC | PRN

## 2014-01-28 MED ORDER — FENTANYL CITRATE 0.05 MG/ML IJ SOLN
INTRAMUSCULAR | Status: AC
  Filled 2014-01-28: qty 4

## 2014-01-28 MED ORDER — DEXAMETHASONE SODIUM PHOSPHATE 4 MG/ML IJ SOLN
INTRAMUSCULAR | Status: DC | PRN
  Administered 2014-01-28: 10 mg via INTRAVENOUS

## 2014-01-28 MED ORDER — ONDANSETRON HCL 4 MG/2ML IJ SOLN
INTRAMUSCULAR | Status: DC | PRN
  Administered 2014-01-28: 4 mg via INTRAVENOUS

## 2014-01-28 MED ORDER — LIDOCAINE HCL 2 % IJ SOLN
INTRAMUSCULAR | Status: AC
  Filled 2014-01-28: qty 20

## 2014-01-28 MED ORDER — HYDROMORPHONE HCL PF 1 MG/ML IJ SOLN: 0.2500 mg | INTRAMUSCULAR | Status: DC | PRN

## 2014-01-28 MED ORDER — LIDOCAINE HCL (CARDIAC) 20 MG/ML IV SOLN
INTRAVENOUS | Status: DC | PRN
  Administered 2014-01-28: 50 mg via INTRAVENOUS

## 2014-01-28 MED ORDER — LACTATED RINGERS IV SOLN
INTRAVENOUS | Status: DC
  Administered 2014-01-28: 08:00:00 via INTRAVENOUS

## 2014-01-28 MED ORDER — MIDAZOLAM HCL 5 MG/5ML IJ SOLN
INTRAMUSCULAR | Status: DC | PRN
  Administered 2014-01-28: 2 mg via INTRAVENOUS

## 2014-01-28 SURGICAL SUPPLY — 41 items
BANDAGE ADH SHEER 1  50/CT (GAUZE/BANDAGES/DRESSINGS) IMPLANT
BANDAGE COBAN STERILE 2 (GAUZE/BANDAGES/DRESSINGS) IMPLANT
BANDAGE ELASTIC 3 VELCRO ST LF (GAUZE/BANDAGES/DRESSINGS) ×2 IMPLANT
BLADE SURG 15 STRL LF DISP TIS (BLADE) ×1 IMPLANT
BLADE SURG 15 STRL SS (BLADE) ×2
BNDG CMPR 9X4 STRL LF SNTH (GAUZE/BANDAGES/DRESSINGS) ×1
BNDG ESMARK 4X9 LF (GAUZE/BANDAGES/DRESSINGS) ×1 IMPLANT
BRUSH SCRUB EZ PLAIN DRY (MISCELLANEOUS) ×2 IMPLANT
CORDS BIPOLAR (ELECTRODE) IMPLANT
COVER MAYO STAND STRL (DRAPES) ×2 IMPLANT
COVER TABLE BACK 60X90 (DRAPES) ×2 IMPLANT
CUFF TOURNIQUET SINGLE 18IN (TOURNIQUET CUFF) ×1 IMPLANT
DECANTER SPIKE VIAL GLASS SM (MISCELLANEOUS) IMPLANT
DRAPE EXTREMITY T 121X128X90 (DRAPE) ×2 IMPLANT
DRAPE SURG 17X23 STRL (DRAPES) ×2 IMPLANT
GAUZE SPONGE 4X4 12PLY STRL (GAUZE/BANDAGES/DRESSINGS) ×2 IMPLANT
GLOVE BIO SURGEON STRL SZ 6.5 (GLOVE) ×1 IMPLANT
GLOVE BIOGEL M STRL SZ7.5 (GLOVE) ×2 IMPLANT
GLOVE BIOGEL PI IND STRL 7.0 (GLOVE) IMPLANT
GLOVE BIOGEL PI INDICATOR 7.0 (GLOVE) ×2
GLOVE ORTHO TXT STRL SZ7.5 (GLOVE) ×2 IMPLANT
GOWN STRL REUS W/ TWL LRG LVL3 (GOWN DISPOSABLE) ×1 IMPLANT
GOWN STRL REUS W/ TWL XL LVL3 (GOWN DISPOSABLE) ×2 IMPLANT
GOWN STRL REUS W/TWL LRG LVL3 (GOWN DISPOSABLE) ×2
GOWN STRL REUS W/TWL XL LVL3 (GOWN DISPOSABLE) ×4
NEEDLE 27GAX1X1/2 (NEEDLE) ×1 IMPLANT
PACK BASIN DAY SURGERY FS (CUSTOM PROCEDURE TRAY) ×2 IMPLANT
PAD CAST 3X4 CTTN HI CHSV (CAST SUPPLIES) ×1 IMPLANT
PADDING CAST ABS 4INX4YD NS (CAST SUPPLIES) ×1
PADDING CAST ABS COTTON 4X4 ST (CAST SUPPLIES) ×1 IMPLANT
PADDING CAST COTTON 3X4 STRL (CAST SUPPLIES) ×2
SPLINT PLASTER CAST XFAST 3X15 (CAST SUPPLIES) ×5 IMPLANT
SPLINT PLASTER XTRA FASTSET 3X (CAST SUPPLIES) ×5
STOCKINETTE 4X48 STRL (DRAPES) ×2 IMPLANT
STRIP CLOSURE SKIN 1/2X4 (GAUZE/BANDAGES/DRESSINGS) ×2 IMPLANT
SUT PROLENE 3 0 PS 2 (SUTURE) ×2 IMPLANT
SYR 3ML 23GX1 SAFETY (SYRINGE) IMPLANT
SYR CONTROL 10ML LL (SYRINGE) ×1 IMPLANT
TOWEL OR 17X24 6PK STRL BLUE (TOWEL DISPOSABLE) ×2 IMPLANT
TRAY DSU PREP LF (CUSTOM PROCEDURE TRAY) ×2 IMPLANT
UNDERPAD 30X30 INCONTINENT (UNDERPADS AND DIAPERS) ×2 IMPLANT

## 2014-01-28 NOTE — Brief Op Note (Signed)
01/28/2014  9:52 AM  PATIENT:  Saul Fordyceerri J Maduro  58 y.o. female  PRE-OPERATIVE DIAGNOSIS:  RIGHT CARPAL TUNNEL SYNDROME  POST-OPERATIVE DIAGNOSIS:  right carpal tunnel syndrome  PROCEDURE:  Procedure(s): RIGHT CARPAL TUNNEL RELEASE (Right)  SURGEON:  Surgeon(s) and Role:    * Wyn Forsterobert Sypher Jr V, MD - Primary  PHYSICIAN ASSISTANT:   ASSISTANTS: surgical tech   ANESTHESIA:   general  EBL:  Total I/O In: 500 [I.V.:500] Out: -   BLOOD ADMINISTERED:none  DRAINS: none   LOCAL MEDICATIONS USED:  LIDOCAINE   SPECIMEN:  No Specimen  DISPOSITION OF SPECIMEN:  N/A  COUNTS:  YES  TOURNIQUET:   Total Tourniquet Time Documented: Upper Arm (Right) - 15 minutes Total: Upper Arm (Right) - 15 minutes   DICTATION: .Other Dictation: Dictation Number [id number]  PLAN OF CARE: Discharge to home after PACU  PATIENT DISPOSITION:  PACU - hemodynamically stable.   Delay start of Pharmacological VTE agent (>24hrs) due to surgical blood loss or risk of bleeding: not applicable

## 2014-01-28 NOTE — Discharge Instructions (Addendum)

## 2014-01-28 NOTE — Anesthesia Preprocedure Evaluation (Signed)
Anesthesia Evaluation  Patient identified by MRN, date of birth, ID band Patient awake    Reviewed: Allergy & Precautions, H&P , NPO status , Patient's Chart, lab work & pertinent test results  Airway Mallampati: II TM Distance: >3 FB Neck ROM: Full    Dental no notable dental hx. (+) Teeth Intact, Dental Advisory Given   Pulmonary neg pulmonary ROS,  breath sounds clear to auscultation  Pulmonary exam normal       Cardiovascular negative cardio ROS  Rhythm:Regular Rate:Normal     Neuro/Psych Anxiety negative neurological ROS     GI/Hepatic negative GI ROS, Neg liver ROS,   Endo/Other  negative endocrine ROS  Renal/GU negative Renal ROS  negative genitourinary   Musculoskeletal   Abdominal   Peds  Hematology negative hematology ROS (+)   Anesthesia Other Findings   Reproductive/Obstetrics negative OB ROS                           Anesthesia Physical Anesthesia Plan  ASA: II  Anesthesia Plan: General   Post-op Pain Management:    Induction: Intravenous  Airway Management Planned: LMA  Additional Equipment:   Intra-op Plan:   Post-operative Plan: Extubation in OR  Informed Consent: I have reviewed the patients History and Physical, chart, labs and discussed the procedure including the risks, benefits and alternatives for the proposed anesthesia with the patient or authorized representative who has indicated his/her understanding and acceptance.   Dental advisory given  Plan Discussed with: CRNA  Anesthesia Plan Comments:         Anesthesia Quick Evaluation

## 2014-01-28 NOTE — Transfer of Care (Signed)
Immediate Anesthesia Transfer of Care Note  Patient: Deanna Peters  Procedure(s) Performed: Procedure(s): RIGHT CARPAL TUNNEL RELEASE (Right)  Patient Location: PACU  Anesthesia Type:General  Level of Consciousness: sedated  Airway & Oxygen Therapy: Patient Spontanous Breathing and Patient connected to face mask oxygen  Post-op Assessment: Report given to PACU RN and Post -op Vital signs reviewed and stable  Post vital signs: Reviewed and stable  Complications: No apparent anesthesia complications

## 2014-01-28 NOTE — Op Note (Signed)
[id number]  

## 2014-01-28 NOTE — Anesthesia Postprocedure Evaluation (Signed)
  Anesthesia Post-op Note  Patient: Deanna Peters  Procedure(s) Performed: Procedure(s): RIGHT CARPAL TUNNEL RELEASE (Right)  Patient Location: PACU  Anesthesia Type:General  Level of Consciousness: awake and alert   Airway and Oxygen Therapy: Patient Spontanous Breathing  Post-op Pain: none  Post-op Assessment: Post-op Vital signs reviewed, Patient's Cardiovascular Status Stable and Respiratory Function Stable  Post-op Vital Signs: Reviewed  Filed Vitals:   01/28/14 1015  BP: 115/78  Pulse: 64  Temp:   Resp: 13    Complications: No apparent anesthesia complications

## 2014-01-28 NOTE — Op Note (Signed)
[name]:  Varghese, Lyndia               ACCOUNT NO.:  [id number]  MEDICAL RECORD NO.:  [id number]  LOCATION:                                 FACILITY:  PHYSICIAN:  Katy Fitchobert V. Sypher, M.D. 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  OPERATION:  Release of right transverse carpal ligament.  OPERATING SURGEON:  Katy Fitchobert V. Sypher, MD  ASSISTANT:  Surgical technician.  ANESTHESIA:  General by LMA.  SUPERVISING ANESTHESIOLOGIST:  Dr. Sampson GoonFitzgerald.  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  I have known Ms. Deanna Peters as a family friend for a number of years.  She and I have discussed the fact that her hands were getting numb at night and she had clinical signs of carpal tunnel syndrome.  I invited her to come by for consultation at some point at her convenience.  She presented to my office with complaints of bilateral hand numbness. Clinical examination suggested carpal tunnel syndrome. Electrodiagnostic studies confirmed bilateral median neuropathy significantly worse on the right than the left.  We advised Ms. Deanna Peters to proceed with release of right transverse carpal ligament.  The surgery, aftercare potential, risks and benefits were detailed.  Questions were invited and answered.  She was [facility] at [facility] by Dr. Autumn PattyEdmond Fitzgerald and had detailed anesthesia informed consent.  Dr. Sampson GoonFitzgerald recommended general anesthesia by LMA technique.  Ms. Deanna Peters accepted Dr. Jarrett AblesFitzgerald's recommendation.  In the holding area, Ms. Jilda PandaKeating's right hand and wrist were marked as the proper surgical site per  protocol.  We discussed the surgery, aftercare, potential risks and benefits as well as use of analgesic medication postoperatively.  Questions were invited and answered.  PROCEDURE:  Deanna Rumbleerri Vazquez was brought to [facility] of the [facility] and placed in supine position on the operating table.  Following the induction of general anesthesia by LMA technique under Dr. Jarrett AblesFitzgerald's direct supervision, her right hand and arm were prepped with Betadine soap and solution and sterilely draped.  A pneumatic tourniquet was applied to the proximal right brachium.  Following exsanguination of the right arm with an Esmarch bandage, the arterial tourniquet was inflated to 220 mmHg.  The procedure commenced with routine surgical time-out. Thereafter, a 2.5 cm incision was fashioned in the line of the ring finger in the proximal palm.  The subcutaneous tissues were carefully divided taking care to identify the palmar fascia.  The fascia was split in the line of its fibers followed by identification of the superficial palmar arch.  The distal margin of the transverse carpal ligament was isolated and several anomalous muscles were identified and carefully teased apart looking for aberrant motor branches.  The transverse carpal ligament was isolated and subsequently split with tenotomy scissors working from distal to proximal.  The transverse carpal ligament was released into the distal forearm as well as the volar form fascia  4 cm above the distal wrist flexion crease.  This widely opened the carpal canal.  The ulnar bursa was thickened and fibrotic.  No masses or other predicaments were noted.  Bleeding points along the margin of the released ligament were electrocauterized with bipolar current followed by repair of the skin with intradermal 3-0 Prolene and Steri-Strips.  Lidocaine 2% was infiltrated for postoperative comfort.  Ms. Deanna Peters was then placed in compressive dressing of sterile  gauze, sterile Webril, a volar plaster splint, and Coban.  There were no apparent complications.  Note that for aftercare she was provided a prescription for Tylenol with Codeine #3, one tablet p.o. q.4-6 hours p.r.n. pain, 20 tablets without refill.  She is not likely to use the medication postoperatively as she prefers not to use opioids.     Katy Fitchobert V. Sypher, M.D.     RVS/MEDQ  D:  01/28/2014  T:  01/28/2014  Job:  [id number]

## 2014-01-28 NOTE — Anesthesia Procedure Notes (Signed)
Procedure Name: LMA Insertion Date/Time: 01/28/2014 9:27 AM Performed by: Caren MacadamARTER, APRIL W Pre-anesthesia Checklist: Patient identified, Emergency Drugs available, Suction available and Patient being monitored Patient Re-evaluated:Patient Re-evaluated prior to inductionOxygen Delivery Method: Circle System Utilized Preoxygenation: Pre-oxygenation with 100% oxygen Intubation Type: IV induction Ventilation: Mask ventilation without difficulty LMA: LMA inserted LMA Size: 4.0 Number of attempts: 1 Airway Equipment and Method: bite block Placement Confirmation: positive ETCO2 and breath sounds checked- equal and bilateral Tube secured with: Tape Dental Injury: Teeth and Oropharynx as per pre-operative assessment

## 2014-01-29 ENCOUNTER — Encounter (HOSPITAL_BASED_OUTPATIENT_CLINIC_OR_DEPARTMENT_OTHER): Payer: Self-pay | Admitting: Orthopedic Surgery

## 2014-06-02 ENCOUNTER — Encounter (HOSPITAL_BASED_OUTPATIENT_CLINIC_OR_DEPARTMENT_OTHER): Payer: Self-pay | Admitting: Orthopedic Surgery

## 2014-09-29 ENCOUNTER — Ambulatory Visit (INDEPENDENT_AMBULATORY_CARE_PROVIDER_SITE_OTHER): Payer: BLUE CROSS/BLUE SHIELD | Admitting: Nurse Practitioner

## 2014-09-29 ENCOUNTER — Encounter: Payer: Self-pay | Admitting: Nurse Practitioner

## 2014-09-29 VITALS — BP 114/72 | HR 88 | Ht 63.75 in | Wt 160.0 lb

## 2014-09-29 DIAGNOSIS — Z1211 Encounter for screening for malignant neoplasm of colon: Secondary | ICD-10-CM

## 2014-09-29 DIAGNOSIS — Z01419 Encounter for gynecological examination (general) (routine) without abnormal findings: Secondary | ICD-10-CM

## 2014-09-29 DIAGNOSIS — Z0000 Encounter for general adult medical examination without abnormal findings: Secondary | ICD-10-CM | POA: Diagnosis not present

## 2014-09-29 LAB — POCT URINALYSIS DIPSTICK
Bilirubin, UA: NEGATIVE
Blood, UA: NEGATIVE
Glucose, UA: NEGATIVE
Ketones, UA: NEGATIVE
Leukocytes, UA: NEGATIVE
Nitrite, UA: NEGATIVE
Protein, UA: NEGATIVE
Urobilinogen, UA: NEGATIVE
pH, UA: 7

## 2014-09-29 NOTE — Progress Notes (Signed)
Patient ID: Saul Fordyceerri J Eifler, female   DOB: 1956-05-04, 59 y.o.   MRN: [id number] 59 y.o. [id number] Married  Caucasian Fe here for annual exam.  Had emergency appendectomy 10/2013, Right carpal tunnel 01/28/14.  Patient's last menstrual period was 11/29/2005.  Pt is post menopausal.      Sexually active: Yes.    The current method of family planning is post menopausal status.    Exercising: Yes.    Home exercise routine includes walking outside. Gym/ health club routine includes cardio, light weights and tennis.. Smoker:  no  Health Maintenance: Pap:  06/25/12, negative with neg HR HPV (no hx of abnormal pap) MMG:  06/25/12, Bi-Rads 1:  Negative  Colonoscopy:  Never  - will do this year  - IFOB is given BMD:   06/25/12, -1.0/-0.7R/-0.6 TDaP:  09/20/13 Labs:  HB:  PCP  Urine:  Negative    reports that she has never smoked. She has never used smokeless tobacco. She reports that she drinks alcohol. She reports that she does not use illicit drugs.  Past Medical History  Diagnosis Date  . Hyperlipidemia   . Agoraphobia with panic disorder   . Anxiety     Past Surgical History  Procedure Laterality Date  . Wisdom tooth extraction  age 59  . Laparoscopic appendectomy N/A 10/30/2013    Procedure: APPENDECTOMY LAPAROSCOPIC;  Surgeon: Mariella SaaBenjamin T Hoxworth, MD;  Location: [facility];  Service: General;  Laterality: N/A;  . Carpal tunnel release Right 01/28/2014    Procedure: RIGHT CARPAL TUNNEL RELEASE;  Surgeon: Wyn Forsterobert Sypher Jr V, MD;  Location: [facility];  Service: Orthopedics;  Laterality: Right;    Current Outpatient Prescriptions  Medication Sig Dispense Refill  . buPROPion (WELLBUTRIN XL) 300 MG 24 hr tablet Take 300 mg by mouth daily.    . sertraline (ZOLOFT) 100 MG tablet Take 100 mg by mouth daily.     No current facility-administered medications for this visit.    Family History  Problem Relation Age of Onset  . Hyperlipidemia Mother   . Heart disease Father   . Heart  disease Paternal Grandfather     ROS:  Pertinent items are noted in HPI.  Otherwise, a comprehensive ROS was negative.  Exam:   BP 114/72 mmHg  Pulse 88  Ht 5' 3.75" (1.619 m)  Wt 160 lb (72.576 kg)  BMI 27.69 kg/m2  LMP 11/29/2005 Height: 5' 3.75" (161.9 cm) Ht Readings from Last 3 Encounters:  09/29/14 5' 3.75" (1.619 m)  01/28/14 5\' 4"  (1.626 m)  09/20/13 5' 3.75" (1.619 m)    General appearance: alert, cooperative and appears stated age Head: Normocephalic, without obvious abnormality, atraumatic Neck: no adenopathy, supple, symmetrical, trachea midline and thyroid normal to inspection and palpation Lungs: clear to auscultation bilaterally Breasts: normal appearance, no masses or tenderness Heart: regular rate and rhythm Abdomen: soft, non-tender; no masses,  no organomegaly Extremities: extremities normal, atraumatic, no cyanosis or edema Skin: Skin color, texture, turgor normal. No rashes or lesions Lymph nodes: Cervical, supraclavicular, and axillary nodes normal. No abnormal inguinal nodes palpated Neurologic: Grossly normal   Pelvic: External genitalia:  no lesions              Urethra:  normal appearing urethra with no masses, tenderness or lesions              Bartholin's and Skene's: normal                 Vagina: normal appearing  vagina with normal color and discharge, no lesions              Cervix: anteverted              Pap taken: Yes.   Bimanual Exam:  Uterus:  normal size, contour, position, consistency, mobility, non-tender              Adnexa: no mass, fullness, tenderness               Rectovaginal: Confirms               Anus:  normal sphincter tone, no lesions  Chaperone present:  yes  A:  Well Woman with normal exam  Postmenopausal - no HRT History of high cholesterol and is seeing nutritionist History of Agoraphobia/ panic disorder  P:   Reviewed health and wellness pertinent to exam  Pap smear taken  today  Mammogram is due now  IFOB is given  Counseled on breast self exam, mammography screening, adequate intake of calcium and vitamin D, diet and exercise, Kegel's exercises return annually or prn  An After Visit Summary was printed and given to the patient.

## 2014-09-29 NOTE — Progress Notes (Signed)
Encounter reviewed by Dr. Brook Silva.  

## 2014-09-29 NOTE — Patient Instructions (Signed)

## 2014-09-30 LAB — IPS PAP TEST WITH HPV

## 2014-10-07 LAB — FECAL OCCULT BLOOD, IMMUNOCHEMICAL: IFOBT: POSITIVE

## 2014-10-07 NOTE — Addendum Note (Signed)
Addended by: Elisha HeadlandNIX, Karrin Eisenmenger S on: 10/07/2014 11:35 AM   Modules accepted: Orders

## 2014-10-14 ENCOUNTER — Other Ambulatory Visit: Payer: Self-pay | Admitting: Nurse Practitioner

## 2014-10-14 ENCOUNTER — Telehealth: Payer: Self-pay

## 2014-10-14 ENCOUNTER — Encounter: Payer: Self-pay | Admitting: Internal Medicine

## 2014-10-14 DIAGNOSIS — R195 Other fecal abnormalities: Secondary | ICD-10-CM

## 2014-10-14 DIAGNOSIS — Z1231 Encounter for screening mammogram for malignant neoplasm of breast: Secondary | ICD-10-CM

## 2014-10-14 NOTE — Telephone Encounter (Signed)
Left message to call Kaitlyn at [phone]. 

## 2014-10-14 NOTE — Telephone Encounter (Signed)
Spoke with patient. Advised of results as seen below from Verner Choleborah S. Leonard CNM. Patient is agreeable and verbalizes understanding. Patient is requesting appointment with Dr.Brodie. Advised will place referral and send over all needed information for the appointment. Will speak with their office to schedule and return call. Patient is agreeable.

## 2014-10-14 NOTE — Telephone Encounter (Signed)
-----   Message from Verner Choleborah S Leonard, CNM sent at 10/13/2014  8:52 AM EDT ----- Notify patient that IFOB is positive. She need GI referral for evaluation.

## 2014-10-14 NOTE — Telephone Encounter (Signed)
Office visit notes, labs, demographics, and insurance card faxed to [facility] at [phone] with cover sheet. They are also able to see these on EPIC. Spoke with [facility] and scheduled patient appointment for March 29th at 10am with Shanda BumpsJessica, [place] as Dr.Broadie is booked until May. Spoke with patient. Advised of appointment date and time. Patient is agreeable. Agreeable to see PA.  Routing to provider for final review. Patient agreeable to disposition. Will close encounter

## 2014-10-22 ENCOUNTER — Ambulatory Visit (HOSPITAL_COMMUNITY)
Admission: RE | Admit: 2014-10-22 | Discharge: 2014-10-22 | Disposition: A | Discharge status: Home / Self Care | Payer: BLUE CROSS/BLUE SHIELD | Source: Ambulatory Visit | Attending: Nurse Practitioner | Admitting: Nurse Practitioner

## 2014-10-22 DIAGNOSIS — Z1231 Encounter for screening mammogram for malignant neoplasm of breast: Secondary | ICD-10-CM | POA: Diagnosis present

## 2014-10-27 ENCOUNTER — Other Ambulatory Visit: Payer: Self-pay | Admitting: Nurse Practitioner

## 2014-10-27 DIAGNOSIS — R928 Other abnormal and inconclusive findings on diagnostic imaging of breast: Secondary | ICD-10-CM

## 2014-10-28 ENCOUNTER — Ambulatory Visit
Admission: RE | Admit: 2014-10-28 | Discharge: 2014-10-28 | Disposition: A | Discharge status: Home / Self Care | Payer: BLUE CROSS/BLUE SHIELD | Source: Ambulatory Visit | Attending: Nurse Practitioner | Admitting: Nurse Practitioner

## 2014-10-28 ENCOUNTER — Encounter: Payer: Self-pay | Admitting: Gastroenterology

## 2014-10-28 ENCOUNTER — Ambulatory Visit (INDEPENDENT_AMBULATORY_CARE_PROVIDER_SITE_OTHER): Payer: BLUE CROSS/BLUE SHIELD | Admitting: Gastroenterology

## 2014-10-28 VITALS — BP 114/80 | HR 88 | Ht 63.5 in | Wt 160.5 lb

## 2014-10-28 DIAGNOSIS — R928 Other abnormal and inconclusive findings on diagnostic imaging of breast: Secondary | ICD-10-CM

## 2014-10-28 DIAGNOSIS — R195 Other fecal abnormalities: Secondary | ICD-10-CM

## 2014-10-28 DIAGNOSIS — Z1211 Encounter for screening for malignant neoplasm of colon: Secondary | ICD-10-CM | POA: Insufficient documentation

## 2014-10-28 MED ORDER — MOVIPREP 100 G PO SOLR: 1.0000 | Freq: Once | ORAL | Status: DC

## 2014-10-28 NOTE — Patient Instructions (Signed)

## 2014-10-28 NOTE — Progress Notes (Signed)
     10/28/2014 Deanna Peters [id number] Feb 24, 1956   HISTORY OF PRESENT ILLNESS:  This is a pleasant 59 year old female who is new to our practice and has been referred here by her GYN to discuss colonoscopy.  Patient has never undergone colonoscopy in the past.  She recently had a positive IFOBT on one occasion by her GYN.  Patient denies ever seeing blood in her stools or black stools.  Admits to some diarrhea/loose stools after eating on occasion over the past year or so as her only GI complaint.  She has not yet had her routine labs performed for this year, however, Hgb in 12/2013 was normal 15.3 grams.   Past Medical History  Diagnosis Date  . Hyperlipidemia   . Agoraphobia with panic disorder   . Anxiety    Past Surgical History  Procedure Laterality Date  . Wisdom tooth extraction  age 59  . Laparoscopic appendectomy N/A 10/30/2013    Procedure: APPENDECTOMY LAPAROSCOPIC;  Surgeon: Mariella SaaBenjamin T Hoxworth, MD;  Location: [facility];  Service: General;  Laterality: N/A;  . Carpal tunnel release Right 01/28/2014    Procedure: RIGHT CARPAL TUNNEL RELEASE;  Surgeon: Wyn Forsterobert Sypher Jr V, MD;  Location: [facility];  Service: Orthopedics;  Laterality: Right;  . Tonsillectomy      reports that she has never smoked. She has never used smokeless tobacco. She reports that she drinks alcohol. She reports that she does not use illicit drugs. family history includes Heart disease in her father and paternal grandfather; Hyperlipidemia in her mother. Allergies  Allergen Reactions  . Nickel Itching and Swelling  . Sulfa Antibiotics Itching and Rash      Outpatient Encounter Prescriptions as of 10/28/2014  Medication Sig  . buPROPion (WELLBUTRIN XL) 300 MG 24 hr tablet Take 300 mg by mouth daily.  . Nutritional Supplements (JUICE PLUS FIBRE PO) Take 2 tablets by mouth daily.  . sertraline (ZOLOFT) 100 MG tablet Take 100 mg by mouth daily.     REVIEW OF SYSTEMS  : All other systems  reviewed and negative except where noted in the History of Present Illness.   PHYSICAL EXAM: BP 114/80 mmHg  Pulse 88  Ht 5' 3.5" (1.613 m)  Wt 160 lb 8 oz (72.802 kg)  BMI 27.98 kg/m2  LMP 11/29/2005 General: Well developed white female in no acute distress Head: Normocephalic and atraumatic Eyes:  Sclerae anicteric, conjunctiva pink. Ears: Normal auditory acuity Lungs: Clear throughout to auscultation Heart: Regular rate and rhythm Abdomen: Soft, non-distended.  Normal bowel sounds.  Non-tender. Rectal:  Will be done at the time of colonoscopy. Musculoskeletal: Symmetrical with no gross deformities  Skin: No lesions on visible extremities Extremities: No edema  Neurological: Alert oriented x 4, grossly non-focal Psychological:  Alert and cooperative. Normal mood and affect  ASSESSMENT AND PLAN: -Screening colonoscopy:  Patient has never undergone procedure in the past.  Will schedule with Dr. Juanda ChanceBrodie.  The risks, benefits, and alternatives were discussed with the patient and she consents to proceed.  -Heme positive stool:  Positive IFOBT x 1.  Colonoscopy as above.  CC:  Roanna BanningGrubb, Patricia R, FNP

## 2014-10-28 NOTE — Progress Notes (Signed)
Reviewed and agree.

## 2014-11-06 ENCOUNTER — Other Ambulatory Visit: Payer: Self-pay | Admitting: *Deleted

## 2014-11-06 ENCOUNTER — Ambulatory Visit (AMBULATORY_SURGERY_CENTER): Payer: BLUE CROSS/BLUE SHIELD | Admitting: Internal Medicine

## 2014-11-06 ENCOUNTER — Encounter: Payer: Self-pay | Admitting: Internal Medicine

## 2014-11-06 VITALS — BP 124/70 | HR 64 | Temp 97.6°F | Resp 13 | Ht 63.0 in | Wt 160.0 lb

## 2014-11-06 DIAGNOSIS — K640 First degree hemorrhoids: Secondary | ICD-10-CM

## 2014-11-06 DIAGNOSIS — Z1211 Encounter for screening for malignant neoplasm of colon: Secondary | ICD-10-CM

## 2014-11-06 DIAGNOSIS — R195 Other fecal abnormalities: Secondary | ICD-10-CM

## 2014-11-06 MED ORDER — SODIUM CHLORIDE 0.9 % IV SOLN: 500.0000 mL | INTRAVENOUS | Status: DC

## 2014-11-06 NOTE — Patient Instructions (Addendum)
Complete Hemocult tests after 11/12/14. Mail the test back to [facility] or hand deliver (this lab is located in the basement of this building).   YOU HAD AN ENDOSCOPIC PROCEDURE TODAY AT THE [facility]:   Refer to the procedure report that was given to you for any specific questions about what was found during the examination.  If the procedure report does not answer your questions, please call your gastroenterologist to clarify.  If you requested that your care partner not be given the details of your procedure findings, then the procedure report has been included in a sealed envelope for you to review at your convenience later.  YOU SHOULD EXPECT: Some feelings of bloating in the abdomen. Passage of more gas than usual.  Walking can help get rid of the air that was put into your GI tract during the procedure and reduce the bloating. If you had a lower endoscopy (such as a colonoscopy or flexible sigmoidoscopy) you may notice spotting of blood in your stool or on the toilet paper. If you underwent a bowel prep for your procedure, you may not have a normal bowel movement for a few days.  Please Note:  You might notice some irritation and congestion in your nose or some drainage.  This is from the oxygen used during your procedure.  There is no need for concern and it should clear up in a day or so.  SYMPTOMS TO REPORT IMMEDIATELY:   Following lower endoscopy (colonoscopy or flexible sigmoidoscopy):  Excessive amounts of blood in the stool  Significant tenderness or worsening of abdominal pains  Swelling of the abdomen that is new, acute  Fever of 100F or higher   For urgent or emergent issues, a gastroenterologist can be reached at any hour by calling [phone].   DIET: Your first meal following the procedure should be a small meal and then it is ok to progress to your normal diet. Heavy or fried foods are harder to digest and may make you feel nauseous or bloated.   Likewise, meals heavy in dairy and vegetables can increase bloating.  Drink plenty of fluids but you should avoid alcoholic beverages for 24 hours.  ACTIVITY:  You should plan to take it easy for the rest of today and you should NOT DRIVE or use heavy machinery until tomorrow (because of the sedation medicines used during the test).    FOLLOW UP: Our staff will call the number listed on your records the next business day following your procedure to check on you and address any questions or concerns that you may have regarding the information given to you following your procedure. If we do not reach you, we will leave a message.  However, if you are feeling well and you are not experiencing any problems, there is no need to return our call.  We will assume that you have returned to your regular daily activities without incident.  If any biopsies were taken you will be contacted by phone or by letter within the next 1-3 weeks.  Please call us at [phone] if you have not heard about the biopsies in 3 weeks.    SIGNATURES/CONFIDENTIALITY: You and/or your care partner have signed paperwork which will be entered into your electronic medical record.  These signatures attest to the fact that that the information above on your After Visit Summary has been reviewed and is understood.  Full responsibility of the confidentiality of this discharge information lies with you and/or  your care-partner. 

## 2014-11-06 NOTE — Op Note (Signed)
[facility] [address] [place], [id number]   COLONOSCOPY PROCEDURE REPORT  PATIENT: Deanna Peters, Deanna Peters  MR#: [id number] BIRTHDATE: 1956/01/02 , 58  yrs. old GENDER: female ENDOSCOPIST: Deanna Carwinora M Saraiyah Hemminger, MD REFERRED XB:JYNWGBY:James Kim, M.D. PROCEDURE DATE:  11/06/2014 PROCEDURE:   Colonoscopy, screening First Screening Colonoscopy - Avg.  risk and is 50 yrs.  old or older Yes.  Prior Negative Screening - Now for repeat screening. N/A  History of Adenoma - Now for follow-up colonoscopy & has been > or = to 3 yrs.  N/A ASA CLASS:   Class I INDICATIONS:Screening for colonic neoplasia, Colorectal Neoplasm Risk Assessment for this procedure is average risk, and heme positive stool. MEDICATIONS: Monitored anesthesia care and Propofol 250 mg IV  DESCRIPTION OF PROCEDURE:   After the risks benefits and alternatives of the procedure were thoroughly explained, informed consent was obtained.  The digital rectal exam revealed no abnormalities of the rectum.   The LB [id number]-[id number]  endoscope was introduced through the anus and advanced to the cecum, which was identified by both the appendix and ileocecal valve. No adverse events experienced.   The quality of the prep was good.  (MoviPrep was used)  The instrument was then slowly withdrawn as the colon was fully examined.      COLON FINDINGS: Small internal Grade I hemorrhoids were found. Retroflexed views revealed no abnormalities. The time to cecum = 8.52 Withdrawal time = 6.05   The scope was withdrawn and the procedure completed. COMPLICATIONS: There were no immediate complications.  ENDOSCOPIC IMPRESSION: Small internal Grade I hemorrhoids  RECOMMENDATIONS: High fiber diet Repeat stool hemoccults Recall colonoscopy in 10 years  eSigned:  Hart Carwinora M Marque Bango, MD 11/06/2014 9:48 AM   cc:

## 2014-11-06 NOTE — Progress Notes (Signed)
A/ox3 pleased with MAC, report to Karen RN 

## 2014-11-07 ENCOUNTER — Telehealth: Payer: Self-pay

## 2014-11-07 NOTE — Telephone Encounter (Signed)
  Follow up Call-  Call back number 11/06/2014  Post procedure Call Back phone  # [phone]  Permission to leave phone message Yes     Patient questions:  Do you have a fever, pain , or abdominal swelling? No. Pain Score  0 *  Have you tolerated food without any problems? Yes.    Have you been able to return to your normal activities? Yes.    Do you have any questions about your discharge instructions: Diet   No. Medications  No. Follow up visit  No.  Do you have questions or concerns about your Care? No.  Actions: * If pain score is 4 or above: No action needed, pain <4.

## 2014-11-10 ENCOUNTER — Telehealth: Payer: Self-pay | Admitting: *Deleted

## 2014-11-10 NOTE — Telephone Encounter (Signed)
Called patient to verify she received stool cards after procedure. Left a message for patient to call back.

## 2014-11-11 NOTE — Telephone Encounter (Signed)
Patient states she received stool cards at procedure and will do these for Dr. Juanda ChanceBrodie. Order in EPIC.

## 2015-10-01 ENCOUNTER — Ambulatory Visit: Payer: BLUE CROSS/BLUE SHIELD | Admitting: Nurse Practitioner

## 2015-10-14 ENCOUNTER — Encounter: Payer: Self-pay | Admitting: Nurse Practitioner

## 2015-10-14 ENCOUNTER — Ambulatory Visit (INDEPENDENT_AMBULATORY_CARE_PROVIDER_SITE_OTHER): Payer: BLUE CROSS/BLUE SHIELD | Admitting: Nurse Practitioner

## 2015-10-14 VITALS — BP 116/74 | HR 72 | Ht 63.5 in | Wt 162.0 lb

## 2015-10-14 DIAGNOSIS — Z0000 Encounter for general adult medical examination without abnormal findings: Secondary | ICD-10-CM | POA: Diagnosis not present

## 2015-10-14 DIAGNOSIS — E2839 Other primary ovarian failure: Secondary | ICD-10-CM

## 2015-10-14 DIAGNOSIS — Z01419 Encounter for gynecological examination (general) (routine) without abnormal findings: Secondary | ICD-10-CM

## 2015-10-14 DIAGNOSIS — N760 Acute vaginitis: Secondary | ICD-10-CM | POA: Diagnosis not present

## 2015-10-14 LAB — COMPREHENSIVE METABOLIC PANEL
ALBUMIN: 4 g/dL (ref 3.6–5.1)
ALT: 21 U/L (ref 6–29)
AST: 19 U/L (ref 10–35)
Alkaline Phosphatase: 68 U/L (ref 33–130)
BUN: 16 mg/dL (ref 7–25)
CALCIUM: 9.5 mg/dL (ref 8.6–10.4)
CHLORIDE: 103 mmol/L (ref 98–110)
CO2: 26 mmol/L (ref 20–31)
Creat: 0.88 mg/dL (ref 0.50–0.99)
Glucose, Bld: 74 mg/dL (ref 65–99)
Potassium: 4.6 mmol/L (ref 3.5–5.3)
Sodium: 138 mmol/L (ref 135–146)
TOTAL PROTEIN: 6.8 g/dL (ref 6.1–8.1)
Total Bilirubin: 0.5 mg/dL (ref 0.2–1.2)

## 2015-10-14 LAB — CBC WITH DIFFERENTIAL/PLATELET
BASOS ABS: 0.1 10*3/uL (ref 0.0–0.1)
Basophils Relative: 1 % (ref 0–1)
EOS ABS: 0.2 10*3/uL (ref 0.0–0.7)
Eosinophils Relative: 3 % (ref 0–5)
HCT: 44.1 % (ref 36.0–46.0)
Hemoglobin: 14.7 g/dL (ref 12.0–15.0)
LYMPHS ABS: 1.8 10*3/uL (ref 0.7–4.0)
LYMPHS PCT: 30 % (ref 12–46)
MCH: 29.8 pg (ref 26.0–34.0)
MCHC: 33.3 g/dL (ref 30.0–36.0)
MCV: 89.3 fL (ref 78.0–100.0)
MPV: 9.9 fL (ref 8.6–12.4)
Monocytes Absolute: 0.4 10*3/uL (ref 0.1–1.0)
Monocytes Relative: 7 % (ref 3–12)
NEUTROS PCT: 59 % (ref 43–77)
Neutro Abs: 3.5 10*3/uL (ref 1.7–7.7)
PLATELETS: 252 10*3/uL (ref 150–400)
RBC: 4.94 MIL/uL (ref 3.87–5.11)
RDW: 13.5 % (ref 11.5–15.5)
WBC: 6 10*3/uL (ref 4.0–10.5)

## 2015-10-14 LAB — POCT URINALYSIS DIPSTICK
BILIRUBIN UA: NEGATIVE
Glucose, UA: NEGATIVE
Ketones, UA: NEGATIVE
Nitrite, UA: NEGATIVE
Protein, UA: NEGATIVE
RBC UA: NEGATIVE
Urobilinogen, UA: NEGATIVE
pH, UA: 6

## 2015-10-14 LAB — LIPID PANEL
CHOL/HDL RATIO: 8.2 ratio — AB (ref <=5.0)
Cholesterol: 262 mg/dL — ABNORMAL HIGH (ref 125–200)
HDL: 32 mg/dL — ABNORMAL LOW (ref >=46)
LDL CALC: 155 mg/dL — AB (ref <130)
Triglycerides: 373 mg/dL — ABNORMAL HIGH (ref <150)
VLDL: 75 mg/dL — ABNORMAL HIGH (ref <30)

## 2015-10-14 LAB — TSH: TSH: 2.03 mIU/L

## 2015-10-14 NOTE — Progress Notes (Signed)
Patient ID: Deanna Peters, female   DOB: [birth year], 60 y.o.   MRN: [id number]  60 y.o. [id number] Married  Caucasian Fe here for annual exam.  Some vulvar itching externally for a month but thinks related to dryness.  No recent antibiotics.  Patient's last menstrual period was 11/29/2005.          Sexually active: Yes.    The current method of family planning is post menopausal status.    Exercising: Yes.    cardio and weight training, tennis.  Not doing as much yoga, but is going to start back. Smoker:  no  Health Maintenance: Pap:09/29/14, Negative with neg HR HPV MMG:10/22/14 3D with additional images of left breast 10/28/14, Bi-Rads 1: Negative with routine screening in one year Colonoscopy:11/06/14, repeat in 10 years BMD: 06/25/12, -1.0 Spine / -0.7 Right Femur Neck / -0.6 Left Femur Neck TDaP: 09/20/13 Shingles: 2016 Hep C and HIV: drawn today Labs: HB: 14.0  Urine:  Trace leuk's   reports that she has never smoked. She has never used smokeless tobacco. She reports that she drinks alcohol. She reports that she does not use illicit drugs.  Past Medical History  Diagnosis Date  . Hyperlipidemia   . Agoraphobia with panic disorder   . Anxiety     Past Surgical History  Procedure Laterality Date  . Wisdom tooth extraction  age 41  . Laparoscopic appendectomy N/A 10/30/2013    Procedure: APPENDECTOMY LAPAROSCOPIC;  Surgeon: Mariella Saa, MD;  Location: [facility];  Service: General;  Laterality: N/A;  . Carpal tunnel release Right 01/28/2014    Procedure: RIGHT CARPAL TUNNEL RELEASE;  Surgeon: Wyn Forster, MD;  Location: [facility];  Service: Orthopedics;  Laterality: Right;  . Tonsillectomy      Current Outpatient Prescriptions  Medication Sig Dispense Refill  . buPROPion (WELLBUTRIN XL) 300 MG 24 hr tablet Take 300 mg by mouth daily.    . sertraline (ZOLOFT) 100 MG tablet Take 100 mg by mouth daily.     No current facility-administered medications  for this visit.    Family History  Problem Relation Age of Onset  . Hyperlipidemia Mother   . Heart disease Father   . Heart disease Paternal Grandfather     ROS:  Pertinent items are noted in HPI.  Otherwise, a comprehensive ROS was negative.  Exam:   BP 116/74 mmHg  Pulse 72  Ht 5' 3.5" (1.613 m)  Wt 162 lb (73.483 kg)  BMI 28.24 kg/m2  LMP 11/29/2005 Height: 5' 3.5" (161.3 cm) Ht Readings from Last 3 Encounters:  10/14/15 5' 3.5" (1.613 m)  11/06/14  (1.6 m)  10/28/14 5' 3.5" (1.613 m)    General appearance: alert, cooperative and appears stated age Head: Normocephalic, without obvious abnormality, atraumatic Neck: no adenopathy, supple, symmetrical, trachea midline and thyroid normal to inspection and palpation Lungs: clear to auscultation bilaterally Breasts: normal appearance, no masses or tenderness Heart: regular rate and rhythm Abdomen: soft, non-tender; no masses,  no organomegaly Extremities: extremities normal, atraumatic, no cyanosis or edema Skin: Skin color, texture, turgor normal. No rashes or lesions Lymph nodes: Cervical, supraclavicular, and axillary nodes normal. No abnormal inguinal nodes palpated Neurologic: Grossly normal   Pelvic: External genitalia:  no lesions but atrophic symptoms              Urethra:  normal appearing urethra with no masses, tenderness or lesions  Bartholin's and Skene's: normal                 Vagina: normal appearing vagina with normal color and no discharge, no lesions              Cervix: anteverted              Pap taken: No. Bimanual Exam:  Uterus:  normal size, contour, position, consistency, mobility, non-tender              Adnexa: no mass, fullness, tenderness               Rectovaginal: Confirms               Anus:  normal sphincter tone, no lesions  Chaperone present: yes  A:  Well Woman with normal exam  Postmenopausal - no HRT History of high cholesterol and is seeing  nutritionist and followed by PCP History of Agoraphobia/ panic disorder  P:   Reviewed health and wellness pertinent to exam  Pap smear as above  Mammogram is due now and will schedule  Will follow with labs  If cholesterol is still elevated with make apt to see Dr. Selena BattenKim - new PCP  Counseled on breast self exam, mammography screening, adequate intake of calcium and vitamin D, diet and exercise, Kegel's exercises return annually or prn  An After Visit Summary was printed and given to the patient.

## 2015-10-14 NOTE — Patient Instructions (Signed)

## 2015-10-15 LAB — HEMOGLOBIN A1C
Hgb A1c MFr Bld: 5.5 % (ref <5.7)
MEAN PLASMA GLUCOSE: 111 mg/dL (ref <117)

## 2015-10-15 LAB — HEPATITIS C ANTIBODY: HCV AB: NEGATIVE

## 2015-10-15 LAB — VITAMIN D 25 HYDROXY (VIT D DEFICIENCY, FRACTURES): Vit D, 25-Hydroxy: 22 ng/mL — ABNORMAL LOW (ref 30–100)

## 2015-10-15 LAB — HIV ANTIBODY (ROUTINE TESTING W REFLEX): HIV 1&2 Ab, 4th Generation: NONREACTIVE

## 2015-10-15 LAB — HEMOGLOBIN, FINGERSTICK: Hemoglobin, fingerstick: 14 g/dL (ref 12.0–16.0)

## 2015-10-15 NOTE — Progress Notes (Signed)
Encounter reviewed by Dr. Jalen Daluz Amundson C. Silva.  

## 2016-03-16 DIAGNOSIS — F410 Panic disorder [episodic paroxysmal anxiety] without agoraphobia: Secondary | ICD-10-CM | POA: Diagnosis not present

## 2016-07-01 DIAGNOSIS — Z23 Encounter for immunization: Secondary | ICD-10-CM | POA: Diagnosis not present

## 2016-07-05 DIAGNOSIS — M9903 Segmental and somatic dysfunction of lumbar region: Secondary | ICD-10-CM | POA: Diagnosis not present

## 2016-07-05 DIAGNOSIS — M9905 Segmental and somatic dysfunction of pelvic region: Secondary | ICD-10-CM | POA: Diagnosis not present

## 2016-07-05 DIAGNOSIS — M25452 Effusion, left hip: Secondary | ICD-10-CM | POA: Diagnosis not present

## 2016-07-05 DIAGNOSIS — M9904 Segmental and somatic dysfunction of sacral region: Secondary | ICD-10-CM | POA: Diagnosis not present

## 2016-07-06 ENCOUNTER — Encounter: Payer: Self-pay | Admitting: Nurse Practitioner

## 2016-07-07 ENCOUNTER — Telehealth: Payer: Self-pay

## 2016-07-07 NOTE — Telephone Encounter (Signed)
Spoke with patient regarding MyChart encounter with Ria CommentPatricia Grubb, FNP dated 07/06/2016. Patient is requesting an appointment to see MD next week. Appointment scheduled for 07/11/2016 at 3:45 pm with Dr.Silva. Patient is agreeable to date and time.  Cc: Ria CommentPatricia Grubb, FNP  Routing to provider for final review. Patient agreeable to disposition. Will close encounter.

## 2016-07-11 ENCOUNTER — Encounter: Payer: Self-pay | Admitting: Obstetrics and Gynecology

## 2016-07-11 ENCOUNTER — Ambulatory Visit (INDEPENDENT_AMBULATORY_CARE_PROVIDER_SITE_OTHER): Payer: BLUE CROSS/BLUE SHIELD | Admitting: Obstetrics and Gynecology

## 2016-07-11 VITALS — BP 120/72 | HR 84 | Ht 63.5 in | Wt 164.4 lb

## 2016-07-11 DIAGNOSIS — B3731 Acute candidiasis of vulva and vagina: Secondary | ICD-10-CM

## 2016-07-11 DIAGNOSIS — B373 Candidiasis of vulva and vagina: Secondary | ICD-10-CM

## 2016-07-11 DIAGNOSIS — M25452 Effusion, left hip: Secondary | ICD-10-CM | POA: Diagnosis not present

## 2016-07-11 DIAGNOSIS — M9904 Segmental and somatic dysfunction of sacral region: Secondary | ICD-10-CM | POA: Diagnosis not present

## 2016-07-11 DIAGNOSIS — M9905 Segmental and somatic dysfunction of pelvic region: Secondary | ICD-10-CM | POA: Diagnosis not present

## 2016-07-11 DIAGNOSIS — M9903 Segmental and somatic dysfunction of lumbar region: Secondary | ICD-10-CM | POA: Diagnosis not present

## 2016-07-11 MED ORDER — NYSTATIN-TRIAMCINOLONE 100000-0.1 UNIT/GM-% EX CREA: 1.0000 "application " | TOPICAL_CREAM | Freq: Two times a day (BID) | CUTANEOUS | 0 refills | Status: DC

## 2016-07-11 MED ORDER — FLUCONAZOLE 150 MG PO TABS: ORAL_TABLET | ORAL | 0 refills | Status: DC

## 2016-07-11 NOTE — Progress Notes (Signed)
GYNECOLOGY  VISIT   HPI: 60 y.o.   Married  Caucasian  female   G2P2002 with Patient's last menstrual period was 11/29/2005.   here for external vaginal burning.  Feels constant itching externally.  Symptoms for one year.   Tried using Vagisil powder which helps a little bit.  Monistat made it worse.    Changes the soaps she uses.  Uses Dove on her bottom usually.  No detergent change.  No vaginal dryness with intercourse.  No lotion use.   No chronic abx or steroid use.   Has sensitive skin.  Doing laser tx for skin damage at [facility].   Works out a lot and does hot yoga.  GYNECOLOGIC HISTORY: Patient's last menstrual period was 11/29/2005. Contraception:  Postmenopausal Menopausal hormone therapy:  none Last mammogram:  10/22/14 3D with additional images of left breast 10/28/14, Bi-Rads 1: Negative with routine screening in one year Last pap smear: 09/29/14, Neg:neg HR HPV          OB History    Gravida Para Term Preterm AB Living   2 2 2  0 0 2   SAB TAB Ectopic Multiple Live Births   0 0 0 0 2         Patient Active Problem List   Diagnosis Date Noted  . Encounter for screening colonoscopy 10/28/2014  . Heme positive stool 10/28/2014  . Acute appendicitis 10/30/2013  . Panic disorder with agoraphobia 09/20/2013    Past Medical History:  Diagnosis Date  . Agoraphobia with panic disorder   . Anxiety   . Hyperlipidemia     Past Surgical History:  Procedure Laterality Date  . CARPAL TUNNEL RELEASE Right 01/28/2014   Procedure: RIGHT CARPAL TUNNEL RELEASE;  Surgeon: Wyn Forsterobert Sypher Jr V, MD;  Location: [facility];  Service: Orthopedics;  Laterality: Right;  . LAPAROSCOPIC APPENDECTOMY N/A 10/30/2013   Procedure: APPENDECTOMY LAPAROSCOPIC;  Surgeon: Mariella SaaBenjamin T Hoxworth, MD;  Location: [facility];  Service: General;  Laterality: N/A;  . TONSILLECTOMY    . WISDOM TOOTH EXTRACTION  age 60    Current Outpatient Prescriptions  Medication Sig  Dispense Refill  . buPROPion (WELLBUTRIN XL) 300 MG 24 hr tablet Take 300 mg by mouth daily.    . sertraline (ZOLOFT) 100 MG tablet Take 100 mg by mouth daily.     No current facility-administered medications for this visit.      ALLERGIES: Nickel and Sulfa antibiotics  Family History  Problem Relation Age of Onset  . Hyperlipidemia Mother   . Heart disease Father   . Heart disease Paternal Grandfather     Social History   Social History  . Marital status: Married    Spouse name: N/A  . Number of children: 2  . Years of education: N/A   Occupational History  . homemaker    Social History Main Topics  . Smoking status: Never Smoker  . Smokeless tobacco: Never Used  . Alcohol use 0.0 oz/week     Comment: rare wine every 2 months  . Drug use: No  . Sexual activity: Yes    Birth control/ protection: Post-menopausal   Other Topics Concern  . Not on file   Social History Narrative  . No narrative on file    ROS:  Pertinent items are noted in HPI.  PHYSICAL EXAMINATION:    BP 120/72 (BP Location: Right Arm, Patient Position: Sitting, Cuff Size: Normal)   Pulse 84   Ht 5' 3.5" (1.613  m)   Wt 164 lb 6.4 oz (74.6 kg)   LMP 11/29/2005   BMI 28.67 kg/m     General appearance: alert, cooperative and appears stated age   Pelvic: External genitalia:   Erythema of bilateral labia and intertriginous areas.  Shave bumps (tried to shave recently to help irritation.)              Urethra:  normal appearing urethra with no masses, tenderness or lesions              Bartholins and Skenes: normal                 Vagina: normal appearing vagina with normal color and discharge, no lesions              Cervix: no lesions                Bimanual Exam:  Uterus:  normal size, contour, position, consistency, mobility, non-tender              Adnexa: no mass, fullness, tenderness             Chaperone was present for exam.  ASSESSMENT  Chronic vulvitis due to yeast.    PLAN  Mycolog II cream bid for one week.  Diflucan 150 mg po x 1.  May repeat in 72 hours.  Benadryl or Claritin for pruritis. Get out of gym clothes as soon as work out is done.  No biopsy at this time.  Follow up in 7 - 10 days and prn.   An After Visit Summary was printed and given to the patient.  __15____ minutes face to face time of which over 50% was spent in counseling.

## 2016-07-19 ENCOUNTER — Telehealth: Payer: Self-pay | Admitting: Obstetrics and Gynecology

## 2016-07-19 NOTE — Telephone Encounter (Signed)
Patient canceled her appointment for 7-10 day reck. Patient states she is doing well and will call back to reschedule.

## 2016-07-19 NOTE — Telephone Encounter (Signed)
Thank you for the update.  I have closed the encounter.  

## 2016-07-20 ENCOUNTER — Ambulatory Visit: Payer: BLUE CROSS/BLUE SHIELD | Admitting: Obstetrics and Gynecology

## 2016-07-21 DIAGNOSIS — M9904 Segmental and somatic dysfunction of sacral region: Secondary | ICD-10-CM | POA: Diagnosis not present

## 2016-07-21 DIAGNOSIS — M9903 Segmental and somatic dysfunction of lumbar region: Secondary | ICD-10-CM | POA: Diagnosis not present

## 2016-07-21 DIAGNOSIS — M9905 Segmental and somatic dysfunction of pelvic region: Secondary | ICD-10-CM | POA: Diagnosis not present

## 2016-07-21 DIAGNOSIS — M25452 Effusion, left hip: Secondary | ICD-10-CM | POA: Diagnosis not present

## 2016-08-09 DIAGNOSIS — M9905 Segmental and somatic dysfunction of pelvic region: Secondary | ICD-10-CM | POA: Diagnosis not present

## 2016-08-09 DIAGNOSIS — M9903 Segmental and somatic dysfunction of lumbar region: Secondary | ICD-10-CM | POA: Diagnosis not present

## 2016-08-09 DIAGNOSIS — M9904 Segmental and somatic dysfunction of sacral region: Secondary | ICD-10-CM | POA: Diagnosis not present

## 2016-08-09 DIAGNOSIS — M25452 Effusion, left hip: Secondary | ICD-10-CM | POA: Diagnosis not present

## 2016-08-23 DIAGNOSIS — M25452 Effusion, left hip: Secondary | ICD-10-CM | POA: Diagnosis not present

## 2016-08-23 DIAGNOSIS — M9905 Segmental and somatic dysfunction of pelvic region: Secondary | ICD-10-CM | POA: Diagnosis not present

## 2016-08-23 DIAGNOSIS — M9904 Segmental and somatic dysfunction of sacral region: Secondary | ICD-10-CM | POA: Diagnosis not present

## 2016-08-23 DIAGNOSIS — M9903 Segmental and somatic dysfunction of lumbar region: Secondary | ICD-10-CM | POA: Diagnosis not present

## 2016-09-12 DIAGNOSIS — M9904 Segmental and somatic dysfunction of sacral region: Secondary | ICD-10-CM | POA: Diagnosis not present

## 2016-09-12 DIAGNOSIS — M9905 Segmental and somatic dysfunction of pelvic region: Secondary | ICD-10-CM | POA: Diagnosis not present

## 2016-09-12 DIAGNOSIS — M25452 Effusion, left hip: Secondary | ICD-10-CM | POA: Diagnosis not present

## 2016-09-12 DIAGNOSIS — M9903 Segmental and somatic dysfunction of lumbar region: Secondary | ICD-10-CM | POA: Diagnosis not present

## 2016-10-13 DIAGNOSIS — H5213 Myopia, bilateral: Secondary | ICD-10-CM | POA: Diagnosis not present

## 2016-10-19 ENCOUNTER — Ambulatory Visit (INDEPENDENT_AMBULATORY_CARE_PROVIDER_SITE_OTHER): Payer: BLUE CROSS/BLUE SHIELD | Admitting: Nurse Practitioner

## 2016-10-19 ENCOUNTER — Encounter: Payer: Self-pay | Admitting: Nurse Practitioner

## 2016-10-19 VITALS — BP 116/70 | HR 72 | Resp 16 | Ht 63.25 in | Wt 161.0 lb

## 2016-10-19 DIAGNOSIS — N760 Acute vaginitis: Secondary | ICD-10-CM | POA: Diagnosis not present

## 2016-10-19 DIAGNOSIS — F4011 Social phobia, generalized: Secondary | ICD-10-CM

## 2016-10-19 DIAGNOSIS — Z0000 Encounter for general adult medical examination without abnormal findings: Secondary | ICD-10-CM | POA: Diagnosis not present

## 2016-10-19 DIAGNOSIS — E2839 Other primary ovarian failure: Secondary | ICD-10-CM | POA: Diagnosis not present

## 2016-10-19 DIAGNOSIS — Z01419 Encounter for gynecological examination (general) (routine) without abnormal findings: Secondary | ICD-10-CM | POA: Diagnosis not present

## 2016-10-19 MED ORDER — FLUCONAZOLE 150 MG PO TABS: 150.0000 mg | ORAL_TABLET | Freq: Once | ORAL | 0 refills | Status: AC

## 2016-10-19 MED ORDER — NYSTATIN 100000 UNIT/GM EX CREA: 1.0000 "application " | TOPICAL_CREAM | Freq: Two times a day (BID) | CUTANEOUS | 0 refills | Status: DC

## 2016-10-19 MED ORDER — TRIAMCINOLONE ACETONIDE 0.025 % EX OINT: 1.0000 "application " | TOPICAL_OINTMENT | Freq: Two times a day (BID) | CUTANEOUS | 0 refills | Status: DC

## 2016-10-19 MED ORDER — NYSTATIN-TRIAMCINOLONE 100000-0.1 UNIT/GM-% EX OINT: 1.0000 "application " | TOPICAL_OINTMENT | Freq: Two times a day (BID) | CUTANEOUS | 0 refills | Status: DC

## 2016-10-19 NOTE — Progress Notes (Signed)
61 y.o. G49P2002 Married  Caucasian Fe here for annual exam. No new health problems this past year.  Psychiatrist gives med's ( Dr. Phillip Heal).  Patient's last menstrual period was 11/29/2005.          Sexually active: Yes.    The current method of family planning is post menopausal status.    Exercising: Yes.    weights, yoga, pickleball, walking Smoker:  no  Health Maintenance: Pap:  09/29/14, Negative with neg HR HPV          06/25/12, negative with neg HR HPV (no hx of abnormal pap) MMG:  10/22/14 3D with additional images of left breast 10/28/14, Bi-Rads 1: Negative with routine screening in one year Colonoscopy:  11/06/14, repeat in 10 years BMD:   06/25/12, -1.0 Spine / -0.7 Right Femur Neck / -0.6 Left Femur Neck TDaP:  09/20/13 Shingles: 2016 Pneumonia: unsure Hep C and HIV: 10/14/15 - negative Labs: patient would like to return fasting   reports that she has never smoked. She has never used smokeless tobacco. She reports that she drinks alcohol. She reports that she does not use drugs.  Past Medical History:  Diagnosis Date  . Agoraphobia with panic disorder   . Anxiety   . Hyperlipidemia     Past Surgical History:  Procedure Laterality Date  . CARPAL TUNNEL RELEASE Right 01/28/2014   Procedure: RIGHT CARPAL TUNNEL RELEASE;  Surgeon: Wyn Forster, MD;  Location: [facility];  Service: Orthopedics;  Laterality: Right;  . LAPAROSCOPIC APPENDECTOMY N/A 10/30/2013   Procedure: APPENDECTOMY LAPAROSCOPIC;  Surgeon: Mariella Saa, MD;  Location: [facility];  Service: General;  Laterality: N/A;  . TONSILLECTOMY    . WISDOM TOOTH EXTRACTION  age 23    Current Outpatient Prescriptions  Medication Sig Dispense Refill  . buPROPion (WELLBUTRIN XL) 300 MG 24 hr tablet Take 300 mg by mouth daily.    . sertraline (ZOLOFT) 100 MG tablet Take 100 mg by mouth daily.     No current facility-administered medications for this visit.     Family History  Problem  Relation Age of Onset  . Hyperlipidemia Mother   . Heart disease Father   . Heart disease Paternal Grandfather     ROS:  Pertinent items are noted in HPI.  Otherwise, a comprehensive ROS was negative.  Exam:   BP 116/70 (BP Location: Right Arm, Patient Position: Sitting, Cuff Size: Normal)   Pulse 72   Resp 16   Ht 5' 3.25" (1.607 m)   Wt 161 lb (73 kg)   LMP 11/29/2005   BMI 28.29 kg/m  Height: 5' 3.25" (160.7 cm) Ht Readings from Last 3 Encounters:  10/19/16 5' 3.25" (1.607 m)  07/11/16 5' 3.5" (1.613 m)  10/14/15 5' 3.5" (1.613 m)    General appearance: alert, cooperative and appears stated age Head: Normocephalic, without obvious abnormality, atraumatic Neck: no adenopathy, supple, symmetrical, trachea midline and thyroid normal to inspection and palpation Lungs: clear to auscultation bilaterally Breasts: normal appearance, no masses or tenderness Heart: regular rate and rhythm Abdomen: soft, non-tender; no masses,  no organomegaly Extremities: extremities normal, atraumatic, no cyanosis or edema Skin: Skin color, texture, turgor normal. No rashes or lesions Lymph nodes: Cervical, supraclavicular, and axillary nodes normal. No abnormal inguinal nodes palpated Neurologic: Grossly normal   Pelvic: External genitalia: multiple lesions that are consistent with yeast              Urethra:  normal appearing urethra  with no masses, tenderness or lesions              Bartholin's and Skene's: normal                 Vagina: normal appearing vagina with normal color and discharge, no lesions              Cervix: anteverted              Pap taken: No. Bimanual Exam:  Uterus:  normal size, contour, position, consistency, mobility, non-tender              Adnexa: no mass, fullness, tenderness               Rectovaginal: Confirms               Anus:  normal sphincter tone, no lesions  Chaperone present: yes  A:  Well Woman with normal exam  Postmenopausal - no  HRT History of high cholesterol and is seeing nutritionist and followed by PCP History of Agoraphobia/ panic disorder  External yeast infection   P:   Reviewed health and wellness pertinent to exam  Pap smear as above  mammogram counseled on breast self exam, mammography screening, adequate intake of calcium and vitamin D, diet and exercise return annually or prn  An After Visit Summary was printed and given to the patient.

## 2016-10-19 NOTE — Patient Instructions (Signed)

## 2016-10-20 LAB — WET PREP BY MOLECULAR PROBE
CANDIDA SPECIES: NOT DETECTED
Gardnerella vaginalis: NOT DETECTED
TRICHOMONAS VAG: NOT DETECTED

## 2016-10-20 NOTE — Progress Notes (Signed)
Encounter reviewed by Dr. Janean SarkBrook Amundson C. Silva. Affirm taken.

## 2016-10-24 ENCOUNTER — Other Ambulatory Visit (INDEPENDENT_AMBULATORY_CARE_PROVIDER_SITE_OTHER): Payer: BLUE CROSS/BLUE SHIELD

## 2016-10-24 DIAGNOSIS — Z0000 Encounter for general adult medical examination without abnormal findings: Secondary | ICD-10-CM

## 2016-10-24 LAB — CBC
HCT: 43.3 % (ref 35.0–45.0)
Hemoglobin: 14.6 g/dL (ref 11.7–15.5)
MCH: 29.7 pg (ref 27.0–33.0)
MCHC: 33.7 g/dL (ref 32.0–36.0)
MCV: 88.2 fL (ref 80.0–100.0)
MPV: 9.8 fL (ref 7.5–12.5)
Platelets: 245 10*3/uL (ref 140–400)
RBC: 4.91 MIL/uL (ref 3.80–5.10)
RDW: 13.6 % (ref 11.0–15.0)
WBC: 5.9 10*3/uL (ref 3.8–10.8)

## 2016-10-24 LAB — COMPREHENSIVE METABOLIC PANEL
ALT: 21 U/L (ref 6–29)
AST: 19 U/L (ref 10–35)
Albumin: 4.4 g/dL (ref 3.6–5.1)
Alkaline Phosphatase: 72 U/L (ref 33–130)
BUN: 13 mg/dL (ref 7–25)
CHLORIDE: 103 mmol/L (ref 98–110)
CO2: 28 mmol/L (ref 20–31)
CREATININE: 0.81 mg/dL (ref 0.50–0.99)
Calcium: 9.6 mg/dL (ref 8.6–10.4)
Glucose, Bld: 91 mg/dL (ref 65–99)
Potassium: 4.6 mmol/L (ref 3.5–5.3)
SODIUM: 139 mmol/L (ref 135–146)
TOTAL PROTEIN: 6.8 g/dL (ref 6.1–8.1)
Total Bilirubin: 0.6 mg/dL (ref 0.2–1.2)

## 2016-10-24 LAB — LIPID PANEL
CHOLESTEROL: 288 mg/dL — AB (ref <200)
HDL: 33 mg/dL — AB (ref >50)
LDL Cholesterol: 193 mg/dL — ABNORMAL HIGH (ref <100)
TRIGLYCERIDES: 312 mg/dL — AB (ref <150)
Total CHOL/HDL Ratio: 8.7 Ratio — ABNORMAL HIGH (ref <5.0)
VLDL: 62 mg/dL — AB (ref <30)

## 2016-10-24 LAB — TSH: TSH: 1.92 m[IU]/L

## 2016-10-25 LAB — VITAMIN D 25 HYDROXY (VIT D DEFICIENCY, FRACTURES): Vit D, 25-Hydroxy: 26 ng/mL — ABNORMAL LOW (ref 30–100)

## 2016-10-25 LAB — HEMOGLOBIN A1C
Hgb A1c MFr Bld: 5.2 % (ref <5.7)
Mean Plasma Glucose: 103 mg/dL

## 2017-03-07 ENCOUNTER — Telehealth: Payer: Self-pay | Admitting: Obstetrics & Gynecology

## 2017-03-07 NOTE — Telephone Encounter (Signed)
Left patient a message to call back to reschedule a future appointment that was cancelled by the provider. °

## 2017-05-23 DIAGNOSIS — F410 Panic disorder [episodic paroxysmal anxiety] without agoraphobia: Secondary | ICD-10-CM | POA: Diagnosis not present

## 2017-06-28 DIAGNOSIS — Z1231 Encounter for screening mammogram for malignant neoplasm of breast: Secondary | ICD-10-CM | POA: Diagnosis not present

## 2017-06-28 DIAGNOSIS — Z780 Asymptomatic menopausal state: Secondary | ICD-10-CM | POA: Diagnosis not present

## 2017-07-11 ENCOUNTER — Telehealth: Payer: Self-pay

## 2017-07-11 NOTE — Telephone Encounter (Signed)
Called patient to give her bmd results. Pt aware it is normal & to have repeat in 1715yrs.

## 2017-07-17 ENCOUNTER — Encounter: Payer: Self-pay | Admitting: Certified Nurse Midwife

## 2017-10-19 DIAGNOSIS — R220 Localized swelling, mass and lump, head: Secondary | ICD-10-CM | POA: Diagnosis not present

## 2017-10-19 DIAGNOSIS — L209 Atopic dermatitis, unspecified: Secondary | ICD-10-CM | POA: Diagnosis not present

## 2017-10-23 ENCOUNTER — Other Ambulatory Visit (HOSPITAL_COMMUNITY)
Admission: RE | Admit: 2017-10-23 | Discharge: 2017-10-23 | Disposition: A | Discharge status: Home / Self Care | Payer: BLUE CROSS/BLUE SHIELD | Source: Ambulatory Visit | Attending: Obstetrics and Gynecology | Admitting: Obstetrics and Gynecology

## 2017-10-23 ENCOUNTER — Ambulatory Visit: Payer: BLUE CROSS/BLUE SHIELD | Admitting: Nurse Practitioner

## 2017-10-23 ENCOUNTER — Encounter: Payer: Self-pay | Admitting: Obstetrics and Gynecology

## 2017-10-23 ENCOUNTER — Ambulatory Visit: Payer: BLUE CROSS/BLUE SHIELD | Admitting: Obstetrics and Gynecology

## 2017-10-23 ENCOUNTER — Other Ambulatory Visit: Payer: Self-pay

## 2017-10-23 VITALS — BP 116/60 | HR 84 | Resp 14 | Ht 63.25 in | Wt 163.0 lb

## 2017-10-23 DIAGNOSIS — Z01419 Encounter for gynecological examination (general) (routine) without abnormal findings: Secondary | ICD-10-CM | POA: Diagnosis not present

## 2017-10-23 DIAGNOSIS — Z124 Encounter for screening for malignant neoplasm of cervix: Secondary | ICD-10-CM | POA: Insufficient documentation

## 2017-10-23 DIAGNOSIS — N763 Subacute and chronic vulvitis: Secondary | ICD-10-CM

## 2017-10-23 DIAGNOSIS — E785 Hyperlipidemia, unspecified: Secondary | ICD-10-CM | POA: Diagnosis not present

## 2017-10-23 DIAGNOSIS — E559 Vitamin D deficiency, unspecified: Secondary | ICD-10-CM | POA: Diagnosis not present

## 2017-10-23 DIAGNOSIS — E782 Mixed hyperlipidemia: Secondary | ICD-10-CM | POA: Insufficient documentation

## 2017-10-23 MED ORDER — HYDROXYZINE HCL 10 MG PO TABS: 10.0000 mg | ORAL_TABLET | Freq: Three times a day (TID) | ORAL | 0 refills | Status: DC | PRN

## 2017-10-23 MED ORDER — BETAMETHASONE VALERATE 0.1 % EX OINT: TOPICAL_OINTMENT | CUTANEOUS | 0 refills | Status: DC

## 2017-10-23 NOTE — Patient Instructions (Signed)

## 2017-10-23 NOTE — Progress Notes (Signed)
62 y.o. [id number] MarriedCaucasianF here for annual exam.   She c/o vulvar itching, going on for a year. Varies in intensity. Around her vulva and anus, no vaginal discharge. She does have sensitive skin.  No vaginal bleeding. No dyspareunia.     Patient's last menstrual period was 11/29/2005.          Sexually active: Yes.    The current method of family planning is post menopausal status.    Exercising: Yes.    weights/ cardio/ yoga Smoker:  no  Health Maintenance: Pap:  09-29-14 WNL NEG HR HPV  History of abnormal Pap:  no MMG:  06-28-17 WNL Colonoscopy:  11-06-14 WNL  BMD:   06-28-18 WNL TDaP:  09-20-13 Gardasil: N/A   reports that she has never smoked. She has never used smokeless tobacco. She reports that she drank alcohol. She reports that she does not use drugs. Rare ETOH, 3-4 drinks a year. She is a Futures trader. 2 kids, one is local. Daughter just had twins and has a 5 year old (local). She helps with the kids. Son in [place], married, no kids.   Past Medical History:  Diagnosis Date  . Anxiety   . Hyperlipidemia   . Panic disorder   Well controlled on medication  Past Surgical History:  Procedure Laterality Date  . CARPAL TUNNEL RELEASE Right 01/28/2014   Procedure: RIGHT CARPAL TUNNEL RELEASE;  Surgeon: Wyn Forster, MD;  Location: [facility];  Service: Orthopedics;  Laterality: Right;  . LAPAROSCOPIC APPENDECTOMY N/A 10/30/2013   Procedure: APPENDECTOMY LAPAROSCOPIC;  Surgeon: Mariella Saa, MD;  Location: [facility];  Service: General;  Laterality: N/A;  . TONSILLECTOMY    . WISDOM TOOTH EXTRACTION  age 14    Current Outpatient Medications  Medication Sig Dispense Refill  . buPROPion (WELLBUTRIN XL) 300 MG 24 hr tablet Take 300 mg by mouth daily.    Marland Kitchen nystatin cream (MYCOSTATIN) Apply 1 application topically 2 (two) times daily. Apply to affected area BID for up to 7 days. 30 g 0  . triamcinolone (KENALOG) 0.025 % ointment Apply 1 application  topically 2 (two) times daily. 30 g 0  . sertraline (ZOLOFT) 100 MG tablet Take 100 mg by mouth daily.     No current facility-administered medications for this visit.     Family History  Problem Relation Age of Onset  . Hyperlipidemia Mother   . Heart disease Father   . Heart disease Paternal Grandfather     Review of Systems  Constitutional: Negative.   HENT: Negative.   Eyes: Negative.   Respiratory: Negative.   Cardiovascular: Negative.   Gastrointestinal: Negative.   Endocrine: Negative.   Genitourinary: Negative.        Vaginal itching  Musculoskeletal: Negative.   Skin: Negative.   Allergic/Immunologic: Negative.   Neurological: Negative.   Psychiatric/Behavioral: Negative.     Exam:   BP 116/60 (BP Location: Right Arm, Patient Position: Sitting, Cuff Size: Normal)   Pulse 84   Resp 14   Ht 5' 3.25" (1.607 m)   Wt 163 lb (73.9 kg)   LMP 11/29/2005   BMI 28.65 kg/m   Weight change: @WEIGHTCHANGE @ Height:   Height: 5' 3.25" (160.7 cm)  Ht Readings from Last 3 Encounters:  10/23/17 5' 3.25" (1.607 m)  10/19/16 5' 3.25" (1.607 m)  07/11/16 5' 3.5" (1.613 m)    General appearance: alert, cooperative and appears stated age Head: Normocephalic, without obvious abnormality, atraumatic Neck: no adenopathy, supple,  symmetrical, trachea midline and thyroid normal to inspection and palpation Lungs: clear to auscultation bilaterally Cardiovascular: regular rate and rhythm Breasts: normal appearance, no masses or tenderness Abdomen: soft, non-tender; non distended,  no masses,  no organomegaly Extremities: extremities normal, atraumatic, no cyanosis or edema Skin: Skin color, texture, turgor normal. No rashes or lesions Lymph nodes: Cervical, supraclavicular, and axillary nodes normal. No abnormal inguinal nodes palpated Neurologic: Grossly normal   Pelvic: External genitalia:  no lesions, vulvar whitening on the inner labia major, the labia minora and clitoris,  mild agglutination. No plaques, no fissures.               Urethra:  normal appearing urethra with no masses, tenderness or lesions              Bartholins and Skenes: normal                 Vagina: atrophic appearing vagina with normal color, no discharge, no lesions              Cervix: no lesions               Bimanual Exam:  Uterus:  normal size, contour, position, consistency, mobility, non-tender              Adnexa: no mass, fullness, tenderness               Rectovaginal: Confirms               Anus:  normal sphincter tone, no lesions  Chaperone was present for exam.  A:  Well Woman with normal exam  Vulvitis, suspect lichen simplex chronicus  P:   Pap with hpv  Vaginitis panel  Vulvar skin care reviewed  Discussed breast self exam  Discussed calcium and vit D intake  Labs with primary MD  Mammogram and colonoscopy UTD  Treat with steroid ointment  F/U in 2 weeks

## 2017-10-24 LAB — VAGINITIS/VAGINOSIS, DNA PROBE
CANDIDA SPECIES: NEGATIVE
Gardnerella vaginalis: NEGATIVE
Trichomonas vaginosis: NEGATIVE

## 2017-10-25 LAB — CYTOLOGY - PAP
DIAGNOSIS: NEGATIVE
HPV: NOT DETECTED

## 2017-11-06 ENCOUNTER — Ambulatory Visit: Payer: BLUE CROSS/BLUE SHIELD | Admitting: Obstetrics and Gynecology

## 2017-11-08 ENCOUNTER — Ambulatory Visit: Payer: BLUE CROSS/BLUE SHIELD | Admitting: Obstetrics and Gynecology

## 2017-11-13 ENCOUNTER — Telehealth: Payer: Self-pay | Admitting: Obstetrics and Gynecology

## 2017-11-13 NOTE — Telephone Encounter (Signed)
Patient canceled her two week 11/15/17 and will call later to reschedule.

## 2017-11-15 ENCOUNTER — Ambulatory Visit: Payer: BLUE CROSS/BLUE SHIELD | Admitting: Obstetrics and Gynecology

## 2017-11-15 DIAGNOSIS — L719 Rosacea, unspecified: Secondary | ICD-10-CM | POA: Diagnosis not present

## 2017-11-15 DIAGNOSIS — L821 Other seborrheic keratosis: Secondary | ICD-10-CM | POA: Diagnosis not present

## 2017-12-04 ENCOUNTER — Ambulatory Visit: Payer: BLUE CROSS/BLUE SHIELD | Admitting: Family Medicine

## 2017-12-04 ENCOUNTER — Encounter: Payer: Self-pay | Admitting: Family Medicine

## 2017-12-04 ENCOUNTER — Ambulatory Visit (INDEPENDENT_AMBULATORY_CARE_PROVIDER_SITE_OTHER): Payer: BLUE CROSS/BLUE SHIELD

## 2017-12-04 VITALS — BP 128/82 | HR 78 | Ht 63.25 in | Wt 163.4 lb

## 2017-12-04 DIAGNOSIS — R0781 Pleurodynia: Secondary | ICD-10-CM

## 2017-12-04 DIAGNOSIS — Z0000 Encounter for general adult medical examination without abnormal findings: Secondary | ICD-10-CM

## 2017-12-04 DIAGNOSIS — Z0001 Encounter for general adult medical examination with abnormal findings: Secondary | ICD-10-CM | POA: Diagnosis not present

## 2017-12-04 DIAGNOSIS — G2581 Restless legs syndrome: Secondary | ICD-10-CM

## 2017-12-04 DIAGNOSIS — E559 Vitamin D deficiency, unspecified: Secondary | ICD-10-CM

## 2017-12-04 DIAGNOSIS — F419 Anxiety disorder, unspecified: Secondary | ICD-10-CM | POA: Insufficient documentation

## 2017-12-04 MED ORDER — ROPINIROLE HCL 0.25 MG PO TABS: 0.2500 mg | ORAL_TABLET | Freq: Every day | ORAL | 1 refills | Status: DC

## 2017-12-04 NOTE — Patient Instructions (Signed)
Come back for fasting labs for annual lab work...  Rib pain: I think you hurt your muscle or pulled ligament. Conservative therapy with rest, stretching, heat and ibuprofen. Can take up to 800mg  three times a day with food for pain. Let me know if not getting better. Can take a month or longer to get better. i'll let you know about xray as well.   Restless leg syndrome: starting you on a drug called requip. Start at .  30 minutes to one hour b bed. Can titrate up by .  every 4-5 days, but stop at .

## 2017-12-04 NOTE — Progress Notes (Signed)
Patient: Deanna Peters MRN: [id number] DOB: 1955-11-05 PCP: Orland Mustard, MD     Subjective:  Chief Complaint  Patient presents with  . New Patient (Initial Visit)    rib pain    HPI: The patient is a 62 y.o. female who presents today for complaints of rib pain on the right side. She was bending over a box and the top of the box was pushing into her right upper ribs and she felt a pop. She states she has had pain for about one week. She had pain immediately after the "pop" sensation. She has not used any ice/pain medication/heating pad. If she coughs, sneezes, reaches up these movements tend to make it worse. Also a big deep breath aggrevates it. Not moving makes it better. She rates the pain 7/10 with the above mentioned movements. Pain described as sharp in nature. She has no hx of pathological fractures or osteoporosis.   Had her annual exam with her gyn in march. Is needing lab work.   Hx of vitamin D deficiency: needing lab work for this.   Hx of hyperlipidemia: needing lab work for this. Has never been on medication in the past. Has worked with trainer/nutritionist. Has hx of hyperlipidemia in her family.   RLS: she has complaints of what she feels like is RLS. She usually has at night when watching TV/sitting down. Not necessarily in the bed. She feels like she has to move her legs. Severity varies and symptoms are intermittent. Has never taken medication for this and is wary about starting anything.   Review of Systems  Constitutional: Negative for chills, fatigue and fever.  HENT: Negative for dental problem, ear pain, hearing loss and trouble swallowing.   Eyes: Negative for visual disturbance.  Respiratory: Negative for cough, chest tightness and shortness of breath.   Cardiovascular: Negative for chest pain, palpitations and leg swelling.  Gastrointestinal: Negative for abdominal pain, blood in stool, diarrhea and nausea.  Endocrine: Negative for cold intolerance,  polydipsia, polyphagia and polyuria.  Genitourinary: Negative for dysuria and hematuria.  Musculoskeletal: Negative for arthralgias.       Pain in right upper rib cage   Skin: Negative for rash.  Neurological: Negative for dizziness and headaches.  Psychiatric/Behavioral: Negative for dysphoric mood and sleep disturbance. The patient is not nervous/anxious.      Allergies Patient is allergic to nickel and sulfa antibiotics.  Past Medical History Patient  has a past medical history of Anxiety, Hyperlipidemia, and Panic disorder.  Surgical History Patient  has a past surgical history that includes Wisdom tooth extraction (age 45); laparoscopic appendectomy (N/A, 10/30/2013); Carpal tunnel release (Right, 01/28/2014); and Tonsillectomy.  Family History Pateint's family history includes Heart disease in her father and paternal grandfather; Hyperlipidemia in her mother.  Social History Patient  reports that she has never smoked. She has never used smokeless tobacco. She reports that she drank alcohol. She reports that she does not use drugs.    Objective: BP 128/82 (BP Location: Left Arm, Patient Position: Sitting, Cuff Size: Normal)   Pulse 78   Ht 5' 3.25" (1.607 m)   Wt 163 lb 6.4 oz (74.1 kg)   LMP 11/29/2005   SpO2 95%   BMI 28.72 kg/m  Body mass index is 28.72 kg/m.  Physical Exam  Constitutional: She is oriented to person, place, and time. She appears well-developed and well-nourished.  HENT:  Mouth/Throat: Oropharynx is clear and moist.  Cardiovascular: Normal rate, regular rhythm, normal heart sounds and intact  distal pulses.  No murmur heard. Pulmonary/Chest: Effort normal and breath sounds normal.  Abdominal: Soft. Bowel sounds are normal. She exhibits no distension. There is no tenderness.  Musculoskeletal: She exhibits tenderness.       Arms: Point tenderness to palpation over middle of rib T5 and space between ribs. Small calcified nodule also palpated. No  erythema, edema noted or appreciated. No bruising.   Neurological: She is alert and oriented to person, place, and time.  Skin: Skin is warm and dry. No rash noted.  Psychiatric: She has a normal mood and affect. Her behavior is normal.  Vitals reviewed.    Xray: no rib fracture seen. Interpreted by myself. Official read pending.   Assessment/plan: 1. Rib pain on right side Muscle/ligament strain. Conservative therapy with rest, heat at this point, stretching and NSAIDs. Discussed dosage of ibuprofen and to take with food. Will let her know when I get official read back. Nodule likely calcification or lipoma. Very superficial and mobile. She is to let me know if changes at all. Also discussed rib pain can take some time to get better.  - DG Ribs Unilateral W/Chest Right; Future  2. Vitamin D deficiency Future labs. On replacement.  - VITAMIN D 25 Hydroxy (Vit-D Deficiency, Fractures); Future  3. Annual physical exam Already had her annual exam with gyn. Went over all health maintenance today. utd on her immunizations, colonoscopy, DEXA, mmg, pap smear. She has had zostavax and will think about shringrix.  -already sees nutritionist and trainer. Continue healthy diet and exercise routine.  -regularly sees dentist.  -discussed high cholesterol and will get her labs back before any further treatment.  -will come back for fasting lab work.   - CBC with Differential/Platelet; Future - Lipid panel; Future - TSH; Future - Comprehensive metabolic panel; Future  4. RLS (restless legs syndrome) Checking ferritin. Will do trial of low dose requip at . . Titration discussed. Not to exceed . Wrote out titration schedule. Side effects of drug discussed. She is not sure if she will start this.  - Ferritin; Future  Return if symptoms worsen or fail to improve.     Orland Mustard, MD [facility]  12/04/2017

## 2018-01-05 ENCOUNTER — Telehealth: Payer: Self-pay | Admitting: Family Medicine

## 2018-01-05 NOTE — Telephone Encounter (Signed)
Please remind her to come in for fasting labs!   Thanks  aw

## 2018-01-05 NOTE — Telephone Encounter (Signed)
Called pt 2 times, no VM. Will send a note through my chart.

## 2018-03-16 ENCOUNTER — Telehealth: Payer: Self-pay | Admitting: Family Medicine

## 2018-03-16 ENCOUNTER — Other Ambulatory Visit: Payer: Self-pay

## 2018-03-16 DIAGNOSIS — E559 Vitamin D deficiency, unspecified: Secondary | ICD-10-CM

## 2018-03-16 DIAGNOSIS — Z0000 Encounter for general adult medical examination without abnormal findings: Secondary | ICD-10-CM

## 2018-03-16 NOTE — Telephone Encounter (Signed)
Called and spoke with patient and advised that she needs to come back in for lab appt. Appt scheduled for 8/19 @ 8:30 a.m.

## 2018-03-16 NOTE — Telephone Encounter (Signed)
jey jen- Can you remind her to come in for lab work? We have tried to call her twice and we have sent message in my chart as well. Need labs to continue to px medication.   Thanks!  aw

## 2018-03-19 ENCOUNTER — Other Ambulatory Visit: Payer: Self-pay | Admitting: Family Medicine

## 2018-03-19 ENCOUNTER — Other Ambulatory Visit (INDEPENDENT_AMBULATORY_CARE_PROVIDER_SITE_OTHER): Payer: BLUE CROSS/BLUE SHIELD

## 2018-03-19 ENCOUNTER — Encounter: Payer: Self-pay | Admitting: Family Medicine

## 2018-03-19 DIAGNOSIS — G2581 Restless legs syndrome: Secondary | ICD-10-CM

## 2018-03-19 DIAGNOSIS — Z0000 Encounter for general adult medical examination without abnormal findings: Secondary | ICD-10-CM | POA: Diagnosis not present

## 2018-03-19 DIAGNOSIS — R739 Hyperglycemia, unspecified: Secondary | ICD-10-CM

## 2018-03-19 DIAGNOSIS — E559 Vitamin D deficiency, unspecified: Secondary | ICD-10-CM | POA: Diagnosis not present

## 2018-03-19 DIAGNOSIS — E781 Pure hyperglyceridemia: Secondary | ICD-10-CM

## 2018-03-19 LAB — LIPID PANEL
CHOLESTEROL: 250 mg/dL — AB (ref 0–200)
HDL: 35.8 mg/dL — ABNORMAL LOW (ref >39.00)
Total CHOL/HDL Ratio: 7
Triglycerides: 408 mg/dL — ABNORMAL HIGH (ref 0.0–149.0)

## 2018-03-19 LAB — TSH: TSH: 2 u[IU]/mL (ref 0.35–4.50)

## 2018-03-19 LAB — LDL CHOLESTEROL, DIRECT: LDL DIRECT: 119 mg/dL

## 2018-03-19 LAB — CBC WITH DIFFERENTIAL/PLATELET
BASOS PCT: 1.1 % (ref 0.0–3.0)
Basophils Absolute: 0.1 10*3/uL (ref 0.0–0.1)
EOS PCT: 2.2 % (ref 0.0–5.0)
Eosinophils Absolute: 0.1 10*3/uL (ref 0.0–0.7)
HEMATOCRIT: 44.3 % (ref 36.0–46.0)
HEMOGLOBIN: 14.8 g/dL (ref 12.0–15.0)
Lymphocytes Relative: 27.7 % (ref 12.0–46.0)
Lymphs Abs: 1.6 10*3/uL (ref 0.7–4.0)
MCHC: 33.5 g/dL (ref 30.0–36.0)
MCV: 87.9 fl (ref 78.0–100.0)
MONO ABS: 0.5 10*3/uL (ref 0.1–1.0)
Monocytes Relative: 7.7 % (ref 3.0–12.0)
Neutro Abs: 3.6 10*3/uL (ref 1.4–7.7)
Neutrophils Relative %: 61.3 % (ref 43.0–77.0)
Platelets: 243 10*3/uL (ref 150.0–400.0)
RBC: 5.04 Mil/uL (ref 3.87–5.11)
RDW: 13 % (ref 11.5–15.5)
WBC: 5.9 10*3/uL (ref 4.0–10.5)

## 2018-03-19 LAB — VITAMIN D 25 HYDROXY (VIT D DEFICIENCY, FRACTURES): VITD: 29.57 ng/mL — ABNORMAL LOW (ref 30.00–100.00)

## 2018-03-19 LAB — COMPREHENSIVE METABOLIC PANEL
ALK PHOS: 74 U/L (ref 39–117)
ALT: 20 U/L (ref 0–35)
AST: 17 U/L (ref 0–37)
Albumin: 4.3 g/dL (ref 3.5–5.2)
BILIRUBIN TOTAL: 0.7 mg/dL (ref 0.2–1.2)
BUN: 14 mg/dL (ref 6–23)
CALCIUM: 9.7 mg/dL (ref 8.4–10.5)
CO2: 27 mEq/L (ref 19–32)
CREATININE: 0.94 mg/dL (ref 0.40–1.20)
Chloride: 106 mEq/L (ref 96–112)
GFR: 63.74 mL/min (ref >60.00)
Glucose, Bld: 103 mg/dL — ABNORMAL HIGH (ref 70–99)
Potassium: 4.2 mEq/L (ref 3.5–5.1)
Sodium: 141 mEq/L (ref 135–145)
TOTAL PROTEIN: 7.1 g/dL (ref 6.0–8.3)

## 2018-03-19 LAB — FERRITIN: Ferritin: 99.1 ng/mL (ref 10.0–291.0)

## 2018-04-17 ENCOUNTER — Ambulatory Visit: Payer: BLUE CROSS/BLUE SHIELD | Admitting: Family Medicine

## 2018-04-17 ENCOUNTER — Encounter: Payer: Self-pay | Admitting: Family Medicine

## 2018-04-17 VITALS — BP 118/78 | HR 80 | Temp 98.5°F | Ht 63.25 in | Wt 162.6 lb

## 2018-04-17 DIAGNOSIS — R300 Dysuria: Secondary | ICD-10-CM | POA: Diagnosis not present

## 2018-04-17 DIAGNOSIS — R8281 Pyuria: Secondary | ICD-10-CM

## 2018-04-17 DIAGNOSIS — N390 Urinary tract infection, site not specified: Secondary | ICD-10-CM | POA: Diagnosis not present

## 2018-04-17 LAB — POCT URINALYSIS DIPSTICK
GLUCOSE UA: NEGATIVE
NITRITE UA: NEGATIVE
PROTEIN UA: POSITIVE — AB
Spec Grav, UA: >=1.03 — AB (ref 1.010–1.025)
Urobilinogen, UA: 1 E.U./dL
pH, UA: 6 (ref 5.0–8.0)

## 2018-04-17 MED ORDER — PHENAZOPYRIDINE HCL 200 MG PO TABS: 200.0000 mg | ORAL_TABLET | Freq: Three times a day (TID) | ORAL | 0 refills | Status: DC | PRN

## 2018-04-17 MED ORDER — CEPHALEXIN 500 MG PO CAPS: 500.0000 mg | ORAL_CAPSULE | Freq: Two times a day (BID) | ORAL | 0 refills | Status: AC

## 2018-04-17 NOTE — Progress Notes (Signed)
   Subjective:  Deanna Peters is a 62 y.o. female who presents today for same-day appointment with a chief complaint of dysuria.   HPI:  Dysuria, Acute problem Started yesterday. Worsening today. No fevers or chills. Associated with pressure and urgency. No treatments tried. Symptoms consistent with prior UTIs. No obvious alleviating or aggravating factors. Has been on keflex for UTIs in the past and has done well.   ROS: Per HPI  PMH: She reports that she has never smoked. She has never used smokeless tobacco. She reports that she drank alcohol. She reports that she does not use drugs.  Objective:  Physical Exam: BP 118/78 (BP Location: Right Arm, Patient Position: Sitting, Cuff Size: Normal)   Pulse 80   Temp 98.5 F (36.9 C) (Oral)   Ht 5' 3.25" (1.607 m)   Wt 162 lb 9.6 oz (73.8 kg)   LMP 11/29/2005   SpO2 94%   BMI 28.58 kg/m   Gen: NAD, resting comfortably CV: RRR with no murmurs appreciated Pulm: NWOB, CTAB with no crackles, wheezes, or rhonchi GI: Normal bowel sounds present. Soft, Nontender, Nondistended. MSK: No CVA tenderness.   Results for orders placed or performed in visit on 04/17/18 (from the past 24 hour(s))  POCT urinalysis dipstick     Status: Abnormal   Collection Time: 04/17/18 11:08 AM  Result Value Ref Range   Color, UA Yellow    Clarity, UA Cloudy    Glucose, UA Negative Negative   Bilirubin, UA Moderate (2+)    Ketones, UA Trace    Spec Grav, UA >=1.030 (A) 1.010 - 1.025   Blood, UA Large (3+)    pH, UA 6.0 5.0 - 8.0   Protein, UA Positive (A) Negative   Urobilinogen, UA 1.0 0.2 or 1.0 E.U./dL   Nitrite, UA Negative    Leukocytes, UA Large (3+) (A) Negative   Appearance     Odor       Assessment/Plan:  Dysuria/Pyuria History and UA consistent with UTI.  Start 1 week course of Keflex.  Start Pyridium.  Check urine culture.  Encouraged good oral hydration.  Discussed reasons to return to care.  Follow-up as needed.  Katina Degreealeb M. Jimmey RalphParker,  MD 04/17/2018 11:40 AM

## 2018-04-19 LAB — URINE CULTURE
MICRO NUMBER:: 91114221
SPECIMEN QUALITY: ADEQUATE

## 2018-04-19 NOTE — Progress Notes (Signed)
Please inform patient of the following:  Her urine culture was inconclusive. I would like for her to finish the full course of antibiotics and let us know if her symptoms do not improve.  Katina Degreealeb M. Jimmey RalphParker, MD 04/19/2018 8:15 AM

## 2018-06-20 DIAGNOSIS — F410 Panic disorder [episodic paroxysmal anxiety] without agoraphobia: Secondary | ICD-10-CM | POA: Diagnosis not present

## 2018-07-04 DIAGNOSIS — Z1231 Encounter for screening mammogram for malignant neoplasm of breast: Secondary | ICD-10-CM | POA: Diagnosis not present

## 2018-07-09 DIAGNOSIS — Z23 Encounter for immunization: Secondary | ICD-10-CM | POA: Diagnosis not present

## 2018-07-17 ENCOUNTER — Encounter: Payer: Self-pay | Admitting: Certified Nurse Midwife

## 2018-08-08 ENCOUNTER — Ambulatory Visit: Payer: BLUE CROSS/BLUE SHIELD | Admitting: Family Medicine

## 2018-08-08 ENCOUNTER — Encounter: Payer: Self-pay | Admitting: Family Medicine

## 2018-08-08 ENCOUNTER — Ambulatory Visit (INDEPENDENT_AMBULATORY_CARE_PROVIDER_SITE_OTHER): Payer: BLUE CROSS/BLUE SHIELD

## 2018-08-08 VITALS — BP 118/76 | HR 76 | Temp 97.6°F | Ht 63.25 in | Wt 157.8 lb

## 2018-08-08 DIAGNOSIS — M898X1 Other specified disorders of bone, shoulder: Secondary | ICD-10-CM

## 2018-08-08 DIAGNOSIS — J029 Acute pharyngitis, unspecified: Secondary | ICD-10-CM | POA: Diagnosis not present

## 2018-08-08 DIAGNOSIS — J040 Acute laryngitis: Secondary | ICD-10-CM | POA: Diagnosis not present

## 2018-08-08 DIAGNOSIS — B9789 Other viral agents as the cause of diseases classified elsewhere: Secondary | ICD-10-CM

## 2018-08-08 DIAGNOSIS — J069 Acute upper respiratory infection, unspecified: Secondary | ICD-10-CM

## 2018-08-08 DIAGNOSIS — M19011 Primary osteoarthritis, right shoulder: Secondary | ICD-10-CM | POA: Diagnosis not present

## 2018-08-08 DIAGNOSIS — R079 Chest pain, unspecified: Secondary | ICD-10-CM | POA: Diagnosis not present

## 2018-08-08 LAB — POCT RAPID STREP A (OFFICE): Rapid Strep A Screen: NEGATIVE

## 2018-08-08 LAB — POCT INFLUENZA A/B
Influenza A, POC: NEGATIVE
Influenza B, POC: NEGATIVE

## 2018-08-08 MED ORDER — BETAMETHASONE SOD PHOS & ACET 6 (3-3) MG/ML IJ SUSP
12.0000 mg | Freq: Once | INTRAMUSCULAR | Status: AC
  Administered 2018-08-08: 12 mg via INTRAMUSCULAR

## 2018-08-08 NOTE — Patient Instructions (Signed)
-  honey off spoon daily for cough -cool mist humidifier and vocal rest -flonase at night to help congestion/sore throat Motrin for aches     Laryngitis Laryngitis is irritation and swelling (inflammation) of your vocal cords. This condition causes symptoms such as:  A change in your voice. It may sound low and hoarse.  Loss of voice.  Coughing.  Sore throat.  Dry throat.  Stuffy nose. Depending on the cause, this condition may go away after a short time or may last for more than 3 weeks. Treatment often involves resting your voice and using medicines to soothe your throat. Follow these instructions at home: Medicines  Take over-the-counter and prescription medicines only as told by your doctor.  If you were prescribed an antibiotic medicine, take it as told by your doctor. Do not stop taking it even if you start to feel better. General instructions  Talk as little as possible. Also avoid whispering.  Write instead of talking. Do this until your voice is back to normal.  Drink enough fluid to keep your pee (urine) pale yellow.  Breathe in moist air. Use a humidifier if you live in a dry climate.  Do not use any products that have nicotine or tobacco in them, such as cigarettes and e-cigarettes. If you need help quitting, ask your doctor. Contact a doctor if:  You have a fever.  Your pain is worse.  Your symptoms do not get better in 2 weeks. Get help right away if:  You cough up blood.  You have trouble swallowing.  You have trouble breathing. Summary  Laryngitis is inflammation of your vocal cords.  This condition causes your voice to sound low and hoarse.  Rest your voice by talking as little as possible. Also avoid whispering. This information is not intended to replace advice given to you by your health care provider. Make sure you discuss any questions you have with your health care provider. Document Released: 07/07/2011 Document Revised: 07/05/2017  Document Reviewed: 07/05/2017 Elsevier Interactive Patient Education  2019 [employer].

## 2018-08-08 NOTE — Progress Notes (Signed)
Patient: Deanna Peters MRN: [id number] DOB: [birth year] PCP: Orland Mustard, MD     Subjective:  Chief Complaint  Patient presents with  . Generalized Body Aches  . Sore Throat  . Cough    HPI: The patient is a 63 y.o. female who presents today for body aches and flu like symptoms. She feels like she got this in early December, but not this bad. 2 weeks ago she had a spell of vomiting, aches and got better. She went on a trip and came back on Saturday and she now has lost her voice, has a horrible sore throat and chills/body aches. She has a dry cough. She has no stomach pain, diarrhea or vomiting. A little bit of nausea. She has taken mucinex, tylenol and motrin. No fever, but has had chills. She does not smoke and has no hx of asthma/copd.   Also would like me to look at bony protrusion on her right proximal clavicle. Has not changed, but it sticks out more than the other side.   Review of Systems  Constitutional: Positive for appetite change, chills and fatigue. Negative for fever.  HENT: Positive for congestion, ear pain, postnasal drip, rhinorrhea and sore throat. Negative for sinus pressure and sinus pain.   Eyes: Positive for photophobia and pain.  Respiratory: Positive for cough. Negative for shortness of breath.   Cardiovascular: Negative for chest pain and palpitations.  Gastrointestinal: Positive for diarrhea and nausea. Negative for abdominal pain and vomiting.  Musculoskeletal: Positive for myalgias and neck pain. Negative for back pain and neck stiffness.       C/o generalized body aches  Neurological: Positive for headaches. Negative for dizziness.  Hematological: Negative for adenopathy.       Glands in neck are swollen and tender to touch  Psychiatric/Behavioral: Negative for sleep disturbance.    Allergies Patient is allergic to nickel and sulfa antibiotics.  Past Medical History Patient  has a past medical history of Anxiety, Chicken pox, Hyperlipidemia, and  Panic disorder.  Surgical History Patient  has a past surgical history that includes Wisdom tooth extraction (age 7); laparoscopic appendectomy (N/A, 10/30/2013); Carpal tunnel release (Right, 01/28/2014); and Tonsillectomy.  Family History Pateint's family history includes Heart disease in her father and paternal grandfather; Hyperlipidemia in her mother.  Social History Patient  reports that she has never smoked. She has never used smokeless tobacco. She reports previous alcohol use. She reports that she does not use drugs.    Objective: Vitals:   08/08/18 1144  BP: 118/76  Pulse: 76  Temp: 97.6 F (36.4 C)  TempSrc: Oral  SpO2: 97%  Weight: 157 lb 12.8 oz (71.6 kg)  Height: 5' 3.25" (1.607 m)    Body mass index is 27.73 kg/m.  Physical Exam Vitals signs reviewed.  HENT:     Right Ear: Tympanic membrane and ear canal normal.     Left Ear: Tympanic membrane and ear canal normal.     Nose:     Comments: No ttp over sinuses    Mouth/Throat:     Mouth: Mucous membranes are moist. Oral lesions present.     Pharynx: Posterior oropharyngeal erythema present.     Tonsils: No tonsillar exudate. Swelling: 0 on the right. 0 on the left.     Comments: Ulcer on right faucial pillar.  Eyes:     Conjunctiva/sclera: Conjunctivae normal.  Neck:     Musculoskeletal: Normal range of motion and neck supple.  Cardiovascular:     Rate  and Rhythm: Normal rate and regular rhythm.     Heart sounds: Normal heart sounds.  Pulmonary:     Effort: Pulmonary effort is normal. No respiratory distress.     Breath sounds: Normal breath sounds. No wheezing or rales.  Abdominal:     General: Bowel sounds are normal.     Palpations: Abdomen is soft.     Tenderness: There is no abdominal tenderness.  Lymphadenopathy:     Cervical: No cervical adenopathy.  Skin:    Capillary Refill: Capillary refill takes less than 2 seconds.     Findings: No rash.  Neurological:     Mental Status: She is alert.         Strep: negative Flu: negative   CXR; no acute process. Official read pending Clavicle xr: no acute process. Official read pending.   Assessment/plan: 1. Viral URI with cough Supportive care with rest, fluids, honey, cool mist humidifier and flonase. Discussed otc cough medication. If worsening cough, shortness of breath or fever she is to return.  - POCT Influenza A/B  2. Sore throat She has an ulcer on right faucial pillar that is likely very painful. Steroid shot today for pharyngitis and laryngitis. Supportive therapy with warm salt water gurgles, fluids. Let us know if not getting better.  - POCT rapid strep A - betamethasone acetate-betamethasone sodium phosphate (CELESTONE) injection 12 mg  3. Laryngitis Cool mist humidifier, vocal rest and steroid shot today.   4. Right clavicle protrusion -no abnormality seen on xray, but official read is pending. Again discussed feels like calcification from possible injury or just anatomy of how she was made. Will f/u on read of xray to make sure no abnormal bony growth.    Return if symptoms worsen or fail to improve.   Orland Mustard, MD [facility]   08/08/2018

## 2018-09-13 DIAGNOSIS — M9905 Segmental and somatic dysfunction of pelvic region: Secondary | ICD-10-CM | POA: Diagnosis not present

## 2018-09-13 DIAGNOSIS — M7541 Impingement syndrome of right shoulder: Secondary | ICD-10-CM | POA: Diagnosis not present

## 2018-09-13 DIAGNOSIS — M9903 Segmental and somatic dysfunction of lumbar region: Secondary | ICD-10-CM | POA: Diagnosis not present

## 2018-09-13 DIAGNOSIS — M9904 Segmental and somatic dysfunction of sacral region: Secondary | ICD-10-CM | POA: Diagnosis not present

## 2018-09-17 ENCOUNTER — Other Ambulatory Visit: Payer: BLUE CROSS/BLUE SHIELD

## 2018-09-20 DIAGNOSIS — M9903 Segmental and somatic dysfunction of lumbar region: Secondary | ICD-10-CM | POA: Diagnosis not present

## 2018-09-20 DIAGNOSIS — M9905 Segmental and somatic dysfunction of pelvic region: Secondary | ICD-10-CM | POA: Diagnosis not present

## 2018-09-20 DIAGNOSIS — M9904 Segmental and somatic dysfunction of sacral region: Secondary | ICD-10-CM | POA: Diagnosis not present

## 2018-09-20 DIAGNOSIS — M7541 Impingement syndrome of right shoulder: Secondary | ICD-10-CM | POA: Diagnosis not present

## 2018-09-24 DIAGNOSIS — M9903 Segmental and somatic dysfunction of lumbar region: Secondary | ICD-10-CM | POA: Diagnosis not present

## 2018-09-24 DIAGNOSIS — M9904 Segmental and somatic dysfunction of sacral region: Secondary | ICD-10-CM | POA: Diagnosis not present

## 2018-09-24 DIAGNOSIS — M7541 Impingement syndrome of right shoulder: Secondary | ICD-10-CM | POA: Diagnosis not present

## 2018-09-24 DIAGNOSIS — M9905 Segmental and somatic dysfunction of pelvic region: Secondary | ICD-10-CM | POA: Diagnosis not present

## 2018-10-01 DIAGNOSIS — M9905 Segmental and somatic dysfunction of pelvic region: Secondary | ICD-10-CM | POA: Diagnosis not present

## 2018-10-01 DIAGNOSIS — M7541 Impingement syndrome of right shoulder: Secondary | ICD-10-CM | POA: Diagnosis not present

## 2018-10-01 DIAGNOSIS — M9903 Segmental and somatic dysfunction of lumbar region: Secondary | ICD-10-CM | POA: Diagnosis not present

## 2018-10-01 DIAGNOSIS — M9904 Segmental and somatic dysfunction of sacral region: Secondary | ICD-10-CM | POA: Diagnosis not present

## 2018-10-08 DIAGNOSIS — M9905 Segmental and somatic dysfunction of pelvic region: Secondary | ICD-10-CM | POA: Diagnosis not present

## 2018-10-08 DIAGNOSIS — M9904 Segmental and somatic dysfunction of sacral region: Secondary | ICD-10-CM | POA: Diagnosis not present

## 2018-10-08 DIAGNOSIS — M9903 Segmental and somatic dysfunction of lumbar region: Secondary | ICD-10-CM | POA: Diagnosis not present

## 2018-10-08 DIAGNOSIS — M7541 Impingement syndrome of right shoulder: Secondary | ICD-10-CM | POA: Diagnosis not present

## 2018-11-26 ENCOUNTER — Ambulatory Visit: Payer: BLUE CROSS/BLUE SHIELD | Admitting: Obstetrics and Gynecology

## 2019-01-30 DIAGNOSIS — M25511 Pain in right shoulder: Secondary | ICD-10-CM | POA: Diagnosis not present

## 2019-08-19 ENCOUNTER — Encounter: Payer: Self-pay | Admitting: Certified Nurse Midwife

## 2019-08-29 ENCOUNTER — Encounter: Payer: Self-pay | Admitting: Certified Nurse Midwife

## 2019-09-30 ENCOUNTER — Ambulatory Visit: Payer: BLUE CROSS/BLUE SHIELD | Attending: Internal Medicine

## 2019-09-30 DIAGNOSIS — Z20822 Contact with and (suspected) exposure to covid-19: Secondary | ICD-10-CM

## 2019-10-01 LAB — NOVEL CORONAVIRUS, NAA: SARS-CoV-2, NAA: NOT DETECTED

## 2019-10-22 ENCOUNTER — Encounter: Payer: Self-pay | Admitting: Certified Nurse Midwife

## 2019-11-27 ENCOUNTER — Telehealth: Payer: Self-pay | Admitting: Family Medicine

## 2019-11-27 ENCOUNTER — Ambulatory Visit (INDEPENDENT_AMBULATORY_CARE_PROVIDER_SITE_OTHER): Payer: Medicare Other | Admitting: Family Medicine

## 2019-11-27 ENCOUNTER — Other Ambulatory Visit: Payer: Self-pay

## 2019-11-27 ENCOUNTER — Encounter: Payer: Self-pay | Admitting: Family Medicine

## 2019-11-27 VITALS — BP 110/78 | HR 80 | Temp 98.3°F | Ht 63.25 in | Wt 156.0 lb

## 2019-11-27 DIAGNOSIS — L309 Dermatitis, unspecified: Secondary | ICD-10-CM

## 2019-11-27 MED ORDER — HYDROXYZINE HCL 25 MG PO TABS: 25.0000 mg | ORAL_TABLET | Freq: Three times a day (TID) | ORAL | 0 refills | Status: DC | PRN

## 2019-11-27 MED ORDER — METHYLPREDNISOLONE ACETATE 80 MG/ML IJ SUSP
80.0000 mg | Freq: Once | INTRAMUSCULAR | Status: AC
  Administered 2019-11-27: 80 mg via INTRAMUSCULAR

## 2019-11-27 NOTE — Progress Notes (Signed)
Patient: Deanna Peters MRN: [id number] DOB: [birth year] PCP: Orland Mustard, MD     Subjective:  Chief Complaint  Patient presents with  . Facial Pain    Started late Sunday night.    HPI: The patient is a 64 y.o. female who presents today for Facial pain and swelling. She states it started Sunday night and it's actually better today than it has been. It itches so bad. On Tuesday it was painful and stung. She took benadryl and zyrtec and didn't feel like either of these helped. This has happened in the past when she tried a new facial product, but it has never been this bad or to this extent. She denies any new facial products, soaps, detergents, perfumes. She did use an algae cleaner on Sunday and isn't sure if she touched her face. She was working outside on Sunday doing a lot of cleaning. It is nowhere else on the body. No issues with her throat or breathing. She has put her moisturizer on this only. She is allergic to nickel, but no contact that she is aware of.   Review of Systems  Constitutional: Negative for chills, fatigue and fever.  HENT: Positive for facial swelling.   Eyes: Positive for itching.  Respiratory: Negative for cough and shortness of breath.   Neurological: Negative for dizziness, light-headedness and headaches.    Allergies Patient is allergic to nickel and sulfa antibiotics.  Past Medical History Patient  has a past medical history of Anxiety, Chicken pox, Hyperlipidemia, and Panic disorder.  Surgical History Patient  has a past surgical history that includes Wisdom tooth extraction (age 67); laparoscopic appendectomy (N/A, 10/30/2013); Carpal tunnel release (Right, 01/28/2014); and Tonsillectomy.  Family History Pateint's family history includes Heart disease in her father and paternal grandfather; Hyperlipidemia in her mother.  Social History Patient  reports that she has never smoked. She has never used smokeless tobacco. She reports previous alcohol  use. She reports that she does not use drugs.    Objective: Vitals:   11/27/19 0933  BP: 110/78  Pulse: 80  Temp: 98.3 F (36.8 C)  TempSrc: Temporal  SpO2: 94%  Weight: 156 lb (70.8 kg)  Height: 5' 3.25" (1.607 m)    Body mass index is 27.42 kg/m.  Physical Exam Vitals reviewed.  Constitutional:      Appearance: Normal appearance. She is obese.  HENT:     Head: Normocephalic and atraumatic.  Cardiovascular:     Rate and Rhythm: Normal rate and regular rhythm.     Heart sounds: Normal heart sounds.  Pulmonary:     Effort: Pulmonary effort is normal. No respiratory distress.     Breath sounds: Normal breath sounds. No stridor. No wheezing.  Skin:    Findings: Erythema and rash present.     Comments: Entire face is edematous and erythematous. Has some peeling/scaling on her chin/buccal area bilaterally. Eyelids mildly edematous as well. Extends into her neck area as well with some papular areas.   Neurological:     General: No focal deficit present.     Mental Status: She is alert and oriented to person, place, and time.        Assessment/plan: 1. Dermatitis Appears to be contact dermatitis vs. Sun exposure. Discussed rash usually shows up 48-72 hours after exposure and she still can't pinpoint anything on Friday. It has improved. Will do steroid shot today, hydroxyzine prn for itching/sleep but discussed if true contact dermatitis that this will not help and could do  trial of topical hydrocortisone on the face. If she did have some sensitivity, ibuprofen prn. If not getting better she is to let me know.  - methylPREDNISolone acetate (DEPO-MEDROL) injection 80 mg   This visit occurred during the SARS-CoV-2 public health emergency.  Safety protocols were in place, including screening questions prior to the visit, additional usage of staff PPE, and extensive cleaning of exam room while observing appropriate contact time as indicated for disinfecting solutions.       Return if symptoms worsen or fail to improve.   Orma Flaming, MD [place]   11/27/2019

## 2019-11-27 NOTE — Telephone Encounter (Signed)
Chief Complaint Facial Swelling Reason for Call Symptomatic / Request for Health Information Initial Comment Caller woke up with her face swollen and bright red and her eyes are swollen and red. Both and neck and face are burning and itching. Translation No Nurse Assessment Nurse: Onalee Hua, RN, Daisel Date/Time Lamount Cohen Time): 11/26/2019 5:22:27 PM Confirm and document reason for call. If symptomatic, describe symptoms. ---Caller states she woke up with her face swollen yesterday, very red. States her eyes are swollen and red. States both and neck and face are burning and itching. States feels like a sunburn and dry. Has the patient had close contact with a person known or suspected to have the novel coronavirus illness OR traveled / lives in area with major community spread (including international travel) in the last 14 days from the onset of symptoms? * If Asymptomatic, screen for exposure and travel within the last 14 days. ---No Does the patient have any new or worsening symptoms? ---Yes Will a triage be completed? ---Yes Related visit to physician within the last 2 weeks? ---No Does the PT have any chronic conditions? (i.e. diabetes, asthma, this includes High risk factors for pregnancy, etc.) ---Yes List chronic conditions. ---nickel allergy Is this a behavioral health or substance abuse call? ---No Guidelines Guideline Title Affirmed Question Affirmed Notes Nurse Date/Time Lamount Cohen Time) Face Swelling [1] Swelling is red AND [2] very painful to touch Onalee Hua, RN, Daisel 11/26/2019 5:25:22 PM Disp. Time Lamount Cohen Time) Disposition Final User 11/26/2019 5:02:58 PM Attempt made - message left Onalee Hua, RN, DaiselPLEASE NOTE: All timestamps contained within this report are represented as [place] Standard Time. CONFIDENTIALTY NOTICE: This fax transmission is intended only for the addressee. It contains information that is legally privileged, confidential or otherwise protected  from use or disclosure. If you are not the intended recipient, you are strictly prohibited from reviewing, disclosing, copying using or disseminating any of this information or taking any action in reliance on or regarding this information. If you have received this fax in error, please notify us immediately by telephone so that we can arrange for its return to [place]. Phone: [phone], Toll-Free: [phone], Fax: [phone] Page: 2 of 2 Call Id: [id number] 11/26/2019 5:29:42 PM See HCP within 4 Hours (or PCP triage) Yes Onalee Hua, RN, Daisel Caller Disagree/Comply Disagree Caller Understands Yes PreDisposition Call Doctor Care Advice Given Per Guideline SEE HCP WITHIN 4 HOURS (OR PCP TRIAGE): * IF OFFICE WILL BE OPEN: You need to be seen within the next 3 or 4 hours. Call your doctor (or NP/PA) now or as soon as the office opens. * IF OFFICE WILL BE CLOSED AND NO PCP (PRIMARY CARE PROVIDER) SECOND-LEVEL TRIAGE: You need to be seen within the next 3 or 4 hours. A nearby Urgent Care Center [facility]) is often a good source of care. Another choice is to go to the ED. Go sooner if you become worse. * ACETAMINOPHEN - REGULAR STRENGTH TYLENOL: Take 650 mg (two 325 mg pills) by mouth every 4 to 6 hours as needed. Each Regular Strength Tylenol pill has 325 mg of acetaminophen. The most you should take each day is 3,250 mg (10 pills a day). * IBUPROFEN (E.G., MOTRIN, ADVIL): Take 400 mg (two 200 mg pills) by mouth every 6 hours. The most you should take each day is 1,200 mg (six 200 mg pills), unless your doctor has told you to take more. CARE ADVICE given per Face Swelling (Adult) guideline. Comments User: Irwin Brakeman, RN Date/Time Lamount Cohen Time): 11/26/2019 5:32:19 PM Caller  states she will call the office tomorrow morning for an appt. Referrals REFERRED TO PCP OFFICE

## 2019-11-27 NOTE — Telephone Encounter (Signed)
Patient has been scheduled for 9:20 for today.

## 2019-11-27 NOTE — Telephone Encounter (Signed)
Please schedule her today, you can overbook or just put her insomewhere.  Thanks,  Dr. Artis Flock

## 2019-11-27 NOTE — Patient Instructions (Signed)
1) only safe thing for face is hydrocortisone cream over the counter.  2) could try oatmeal otc lotion/anti itch as well  3) hydroxyzine prn for itchying. May make you drowsy, so be careful 4) steroid shot today.   My other thought is you also got a sunburn. You wouldn't have the itching and swelling, but could be contributing. Main treatment is ibuprofen and cool compresses.   Let me know if not getting better! I would wear gloves and watch the algae stuff and throw away whatever you used on Friday night/day.    Contact Dermatitis Dermatitis is redness, soreness, and swelling (inflammation) of the skin. Contact dermatitis is a reaction to something that touches the skin. There are two types of contact dermatitis:  Irritant contact dermatitis. This happens when something bothers (irritates) your skin, like soap.  Allergic contact dermatitis. This is caused when you are exposed to something that you are allergic to, such as poison ivy. What are the causes?  Common causes of irritant contact dermatitis include: ? Makeup. ? Soaps. ? Detergents. ? Bleaches. ? Acids. ? Metals, such as nickel.  Common causes of allergic contact dermatitis include: ? Plants. ? Chemicals. ? Jewelry. ? Latex. ? Medicines. ? Preservatives in products, such as clothing. What increases the risk?  Having a job that exposes you to things that bother your skin.  Having asthma or eczema. What are the signs or symptoms? Symptoms may happen anywhere the irritant has touched your skin. Symptoms include:  Dry or flaky skin.  Redness.  Cracks.  Itching.  Pain or a burning feeling.  Blisters.  Blood or clear fluid draining from skin cracks. With allergic contact dermatitis, swelling may occur. This may happen in places such as the eyelids, mouth, or genitals. How is this treated?  This condition is treated by checking for the cause of the reaction and protecting your skin. Treatment may also  include: ? Steroid creams, ointments, or medicines. ? Antibiotic medicines or other ointments, if you have a skin infection. ? Lotion or medicines to help with itching. ? A bandage (dressing). Follow these instructions at home: Skin care  Moisturize your skin as needed.  Put cool cloths on your skin.  Put a baking soda paste on your skin. Stir water into baking soda until it looks like a paste.  Do not scratch your skin.  Avoid having things rub up against your skin.  Avoid the use of soaps, perfumes, and dyes. Medicines  Take or apply over-the-counter and prescription medicines only as told by your doctor.  If you were prescribed an antibiotic medicine, take or apply it as told by your doctor. Do not stop using it even if your condition starts to get better. Bathing  Take a bath with: ? Epsom salts. ? Baking soda. ? Colloidal oatmeal.  Bathe less often.  Bathe in warm water. Avoid using hot water. Bandage care  If you were given a bandage, change it as told by your health care provider.  Wash your hands with soap and water before and after you change your bandage. If soap and water are not available, use hand sanitizer. General instructions  Avoid the things that caused your reaction. If you do not know what caused it, keep a journal. Write down: ? What you eat. ? What skin products you use. ? What you drink. ? What you wear in the area that has symptoms. This includes jewelry.  Check the affected areas every day for signs of infection.  Check for: ? More redness, swelling, or pain. ? More fluid or blood. ? Warmth. ? Pus or a bad smell.  Keep all follow-up visits as told by your doctor. This is important. Contact a doctor if:  You do not get better with treatment.  Your condition gets worse.  You have signs of infection, such as: ? More swelling. ? Tenderness. ? More redness. ? Soreness. ? Warmth.  You have a fever.  You have new symptoms. Get  help right away if:  You have a very bad headache.  You have neck pain.  Your neck is stiff.  You throw up (vomit).  You feel very sleepy.  You see red streaks coming from the area.  Your bone or joint near the area hurts after the skin has healed.  The area turns darker.  You have trouble breathing. Summary  Dermatitis is redness, soreness, and swelling of the skin.  Symptoms may occur where the irritant has touched you.  Treatment may include medicines and skin care.  If you do not know what caused your reaction, keep a journal.  Contact a doctor if your condition gets worse or you have signs of infection. This information is not intended to replace advice given to you by your health care provider. Make sure you discuss any questions you have with your health care provider. Document Revised: 11/07/2018 Document Reviewed: 01/31/2018 Elsevier Patient Education  [place].

## 2020-06-03 ENCOUNTER — Telehealth: Payer: Self-pay | Admitting: Family Medicine

## 2020-06-03 NOTE — Telephone Encounter (Signed)
Left message for patient to call back and schedule Medicare Annual Wellness Visit (AWV) either virtually OR in office.   No hx; please schedule at anytime with [facility]-Nurse Health Advisor at [facility].  This should be a 45 minute visit.   

## 2020-06-20 ENCOUNTER — Ambulatory Visit: Payer: Medicare Other | Attending: Internal Medicine

## 2020-06-20 DIAGNOSIS — Z23 Encounter for immunization: Secondary | ICD-10-CM

## 2020-06-20 NOTE — Progress Notes (Signed)
   Covid-19 Vaccination Clinic  Name:  Deanna Peters    MRN: [id number] DOB: Feb 22, 1956  06/20/2020  [name] was observed post Covid-19 immunization for 15 minutes without incident. She was provided with Vaccine Information Sheet and instruction to access the V-Safe system.   [name] was instructed to call 911 with any severe reactions post vaccine: Marland Kitchen Difficulty breathing  . Swelling of face and throat  . A fast heartbeat  . A bad rash all over body  . Dizziness and weakness   Immunizations Administered    No immunizations on file.

## 2020-06-22 ENCOUNTER — Encounter: Payer: Self-pay | Admitting: Family Medicine

## 2020-06-22 DIAGNOSIS — E782 Mixed hyperlipidemia: Secondary | ICD-10-CM

## 2020-06-22 DIAGNOSIS — E559 Vitamin D deficiency, unspecified: Secondary | ICD-10-CM

## 2020-08-24 ENCOUNTER — Encounter: Payer: Self-pay | Admitting: Obstetrics and Gynecology

## 2020-09-22 ENCOUNTER — Other Ambulatory Visit: Payer: Self-pay

## 2020-09-22 ENCOUNTER — Other Ambulatory Visit (INDEPENDENT_AMBULATORY_CARE_PROVIDER_SITE_OTHER): Payer: Medicare Other

## 2020-09-22 DIAGNOSIS — E559 Vitamin D deficiency, unspecified: Secondary | ICD-10-CM | POA: Diagnosis not present

## 2020-09-22 DIAGNOSIS — E782 Mixed hyperlipidemia: Secondary | ICD-10-CM | POA: Diagnosis not present

## 2020-09-22 LAB — VITAMIN D 25 HYDROXY (VIT D DEFICIENCY, FRACTURES): VITD: 27 ng/mL — ABNORMAL LOW (ref 30.00–100.00)

## 2020-09-22 LAB — COMPLETE METABOLIC PANEL WITH GFR
AG Ratio: 1.8 (calc) (ref 1.0–2.5)
ALT: 16 U/L (ref 6–29)
AST: 15 U/L (ref 10–35)
Albumin: 4.3 g/dL (ref 3.6–5.1)
Alkaline phosphatase (APISO): 74 U/L (ref 37–153)
BUN: 18 mg/dL (ref 7–25)
CO2: 27 mmol/L (ref 20–32)
Calcium: 9.5 mg/dL (ref 8.6–10.4)
Chloride: 106 mmol/L (ref 98–110)
Creat: 0.91 mg/dL (ref 0.50–0.99)
GFR, Est African American: 76 mL/min/{1.73_m2} (ref >=60)
GFR, Est Non African American: 66 mL/min/{1.73_m2} (ref >=60)
Globulin: 2.4 g/dL (calc) (ref 1.9–3.7)
Glucose, Bld: 89 mg/dL (ref 65–99)
Potassium: 4.1 mmol/L (ref 3.5–5.3)
Sodium: 141 mmol/L (ref 135–146)
Total Bilirubin: 0.7 mg/dL (ref 0.2–1.2)
Total Protein: 6.7 g/dL (ref 6.1–8.1)

## 2020-09-22 LAB — CBC WITH DIFFERENTIAL/PLATELET
Basophils Absolute: 0.1 10*3/uL (ref 0.0–0.1)
Basophils Relative: 0.9 % (ref 0.0–3.0)
Eosinophils Absolute: 0.2 10*3/uL (ref 0.0–0.7)
Eosinophils Relative: 2.7 % (ref 0.0–5.0)
HCT: 42.5 % (ref 36.0–46.0)
Hemoglobin: 14.6 g/dL (ref 12.0–15.0)
Lymphocytes Relative: 26 % (ref 12.0–46.0)
Lymphs Abs: 1.6 10*3/uL (ref 0.7–4.0)
MCHC: 34.4 g/dL (ref 30.0–36.0)
MCV: 87.3 fl (ref 78.0–100.0)
Monocytes Absolute: 0.5 10*3/uL (ref 0.1–1.0)
Monocytes Relative: 8.1 % (ref 3.0–12.0)
Neutro Abs: 3.9 10*3/uL (ref 1.4–7.7)
Neutrophils Relative %: 62.3 % (ref 43.0–77.0)
Platelets: 238 10*3/uL (ref 150.0–400.0)
RBC: 4.87 Mil/uL (ref 3.87–5.11)
RDW: 13 % (ref 11.5–15.5)
WBC: 6.3 10*3/uL (ref 4.0–10.5)

## 2020-09-22 LAB — LIPID PANEL
Cholesterol: 266 mg/dL — ABNORMAL HIGH (ref 0–200)
HDL: 36.5 mg/dL — ABNORMAL LOW (ref >39.00)
NonHDL: 229.78
Total CHOL/HDL Ratio: 7
Triglycerides: 330 mg/dL — ABNORMAL HIGH (ref 0.0–149.0)
VLDL: 66 mg/dL — ABNORMAL HIGH (ref 0.0–40.0)

## 2020-09-22 LAB — LDL CHOLESTEROL, DIRECT: Direct LDL: 140 mg/dL

## 2020-09-22 NOTE — Addendum Note (Signed)
Addended by: Lurlean Horns on: 09/22/2020 08:29 AM   Modules accepted: Orders

## 2020-09-22 NOTE — Addendum Note (Signed)
Addended by: Lurlean Horns on: 09/22/2020 08:28 AM   Modules accepted: Orders

## 2020-09-22 NOTE — Addendum Note (Signed)
Addended by: Kahiau Schewe P on: 09/22/2020 08:29 AM   Modules accepted: Orders  

## 2020-10-08 ENCOUNTER — Encounter: Payer: Self-pay | Admitting: Family Medicine

## 2020-10-08 ENCOUNTER — Other Ambulatory Visit: Payer: Self-pay

## 2020-10-08 ENCOUNTER — Ambulatory Visit (INDEPENDENT_AMBULATORY_CARE_PROVIDER_SITE_OTHER): Payer: Medicare Other | Admitting: Family Medicine

## 2020-10-08 VITALS — BP 124/78 | HR 81 | Temp 98.0°F | Ht 63.25 in | Wt 157.8 lb

## 2020-10-08 DIAGNOSIS — K219 Gastro-esophageal reflux disease without esophagitis: Secondary | ICD-10-CM

## 2020-10-08 DIAGNOSIS — Z23 Encounter for immunization: Secondary | ICD-10-CM

## 2020-10-08 DIAGNOSIS — E782 Mixed hyperlipidemia: Secondary | ICD-10-CM | POA: Diagnosis not present

## 2020-10-08 DIAGNOSIS — E559 Vitamin D deficiency, unspecified: Secondary | ICD-10-CM

## 2020-10-08 MED ORDER — PANTOPRAZOLE SODIUM 40 MG PO TBEC: 40.0000 mg | DELAYED_RELEASE_TABLET | Freq: Every day | ORAL | 3 refills | Status: DC

## 2020-10-08 NOTE — Progress Notes (Signed)
Patient: Deanna Peters MRN: [id number] DOB: Apr 28, 1956 PCP: Orland Mustard, MD     Subjective:  Chief Complaint  Patient presents with  . Gastroesophageal Reflux  . Discuss labs  . Hyperlipidemia    HPI: The patient is a 65 y.o. female who presents today for symptoms of acid reflux that has worsened in the last month or two. And to discuss labs.  Vitamin D deficiency Level of 27, on no daily vitamin D.   Mixed hyperlipidemia She has hx of elevated cholesterol. She thinks it is more genetic. Her aunt, sister and mother have hypercholesterolia. She does not smoke, have HTN or diabetes. Exercise mild. Hx of heart disease in her father.   GERD She states if she eats certain foods it will burn all of the way down into her stomach and sometimes it feels like something is stuck. She has had this for a while, but it has gotten worse over the last few months. Some salty foods make it worse. Meats. She drinks green tea, red wine (that burns going down). She doesn't take NSAIDs on a frequent basis. She does have hx of traveling to [place] but no other international travel in recent years. She has taken tums and this does help some. She last took this a month ago. She hasn't tried another medication. No nausea/vomiting and no stomach pain. She has no diarrhea/constipation. No blood in stool. She describes it as cancker sores down her esophagus.   The 10-year ASCVD risk score Denman George DC Jr., et al., 2013) is: 7.8%   Review of Systems  Constitutional: Negative for chills, fatigue and fever.  HENT: Negative for dental problem, ear pain, hearing loss and trouble swallowing.   Eyes: Negative for visual disturbance.  Respiratory: Negative for cough, chest tightness and shortness of breath.   Cardiovascular: Negative for chest pain, palpitations and leg swelling.  Gastrointestinal: Negative for abdominal pain, blood in stool, diarrhea, nausea and vomiting.  Endocrine: Negative for cold  intolerance, polydipsia, polyphagia and polyuria.  Genitourinary: Negative for dysuria and hematuria.  Musculoskeletal: Negative for arthralgias.  Skin: Negative for rash.  Neurological: Negative for dizziness and headaches.  Psychiatric/Behavioral: Negative for dysphoric mood and sleep disturbance. The patient is not nervous/anxious.     Allergies Patient is allergic to nickel and sulfa antibiotics.  Past Medical History Patient  has a past medical history of Anxiety, Chicken pox, Hyperlipidemia, and Panic disorder.  Surgical History Patient  has a past surgical history that includes Wisdom tooth extraction (age 50); laparoscopic appendectomy (N/A, 10/30/2013); Carpal tunnel release (Right, 01/28/2014); and Tonsillectomy.  Family History Pateint's family history includes Heart disease in her father and paternal grandfather; Hyperlipidemia in her mother.  Social History Patient  reports that she has never smoked. She has never used smokeless tobacco. She reports previous alcohol use. She reports that she does not use drugs.    Objective: Vitals:   10/08/20 1110  BP: 124/78  Pulse: 81  Temp: 98 F (36.7 C)  TempSrc: Temporal  SpO2: 95%  Weight: 157 lb 12.8 oz (71.6 kg)  Height: 5' 3.25" (1.607 m)    Body mass index is 27.73 kg/m.  Physical Exam Vitals reviewed.  Constitutional:      Appearance: Normal appearance. She is well-developed. She is obese.  HENT:     Head: Normocephalic and atraumatic.     Right Ear: Tympanic membrane, ear canal and external ear normal.     Left Ear: Tympanic membrane, ear canal and external ear  normal.     Nose: Nose normal.     Mouth/Throat:     Mouth: Mucous membranes are moist.  Eyes:     Extraocular Movements: Extraocular movements intact.     Conjunctiva/sclera: Conjunctivae normal.     Pupils: Pupils are equal, round, and reactive to light.  Neck:     Thyroid: No thyromegaly.  Cardiovascular:     Rate and Rhythm: Normal rate and  regular rhythm.     Heart sounds: Normal heart sounds. No murmur heard.   Pulmonary:     Effort: Pulmonary effort is normal.     Breath sounds: Normal breath sounds.  Abdominal:     General: Bowel sounds are normal. There is no distension.     Palpations: Abdomen is soft.     Tenderness: There is no abdominal tenderness.     Comments: Negative murphy's sign   Musculoskeletal:     Cervical back: Normal range of motion and neck supple.  Lymphadenopathy:     Cervical: No cervical adenopathy.  Skin:    General: Skin is warm and dry.     Capillary Refill: Capillary refill takes less than 2 seconds.     Findings: No rash.  Neurological:     General: No focal deficit present.     Mental Status: She is alert and oriented to person, place, and time.     Cranial Nerves: No cranial nerve deficit.     Coordination: Coordination normal.     Deep Tendon Reflexes: Reflexes normal.  Psychiatric:        Mood and Affect: Mood normal.        Behavior: Behavior normal.        Assessment/plan: 1. Mixed hyperlipidemia Discussed lipid panel in detail. Encouraged her to increase cardiovascular exercise to help HDL and discussed this in detail. Her ASCVD risk is 7.8% and discussed statin initiation at 7.5% that I believe she would benefit from. Before we do this, we will check a coronary calcium score as she would like to stay off medication if possible.  The 10-year ASCVD risk score Denman George DC Jr., et al., 2013) is: 7.8%   2. Vitamin D deficiency Discussed repletion. Recommended 5oooIU/day.   3. Gastroesophageal reflux disease without esophagitis Checking h.pylori and starting her on protonix daily since symptoms have been going on x > 6 months. Handout on diet also given. If no relief discussed will need to see GI, she will keep me updated.  - H. pylori breath test  4. Need for vaccination for Strep pneumoniae  - Pneumococcal polysaccharide vaccine 23-valent greater than or equal to 2yo  subcutaneous/IM  This visit occurred during the SARS-CoV-2 public health emergency.  Safety protocols were in place, including screening questions prior to the visit, additional usage of staff PPE, and extensive cleaning of exam room while observing appropriate contact time as indicated for disinfecting solutions.     Return if symptoms worsen or fail to improve.   Orland Mustard, MD [facility]   10/08/2020

## 2020-10-08 NOTE — Patient Instructions (Signed)
-vitamin d is just a touch low, would start over the counter D3 at 5000IU/day. Also want this much higher with covid.   -your cholesterol remains elevated, but your 10 year risk of heart attack or stroke is 7.8%. I start meds at 7.5%. let's check a coronary calcium score and see if there is any plaque build up on your arteries. They will call you with this, it is self pay. I would also work on more actual cardio where heart rate is up x 30 minutes and you can't carry on conversation. E.g. spin class, running, swimming, elliptical machine. I am leaning more toward starting a statin drug though.   -swallowing sounds like reflux. We are going to check for h.pylori which is an infection in your stomach today starting you on medication as well. im going to do px medication called protonix since has been going on longer than 2 weeks. Will take first thing in the AM. If not getting better with this we will need to send to GI so let me know in 3-4 weeks. Also attached a diet for you that can trigger these symptoms.   So good to see you! Dr. Artis Flock    Food Choices for Gastroesophageal Reflux Disease, Adult When you have gastroesophageal reflux disease (GERD), the foods you eat and your eating habits are very important. Choosing the right foods can help ease your discomfort. Think about working with a food expert (dietitian) to help you make good choices. What are tips for following this plan? Reading food labels  Look for foods that are low in saturated fat. Foods that may help with your symptoms include: ? Foods that have less than 5% of daily value (DV) of fat. ? Foods that have 0 grams of trans fat. Cooking  Do not fry your food.  Cook your food by baking, steaming, grilling, or broiling. These are all methods that do not need a lot of fat for cooking.  To add flavor, try to use herbs that are low in spice and acidity. Meal planning  Choose healthy foods that are low in fat, such as: ? Fruits  and vegetables. ? Whole grains. ? Low-fat dairy products. ? Lean meats, fish, and poultry.  Eat small meals often instead of eating 3 large meals each day. Eat your meals slowly in a place where you are relaxed. Avoid bending over or lying down until 2-3 hours after eating.  Limit high-fat foods such as fatty meats or fried foods.  Limit your intake of fatty foods, such as oils, butter, and shortening.  Avoid the following as told by your doctor: ? Foods that cause symptoms. These may be different for different people. Keep a food diary to keep track of foods that cause symptoms. ? Alcohol. ? Drinking a lot of liquid with meals. ? Eating meals during the 2-3 hours before bed.   Lifestyle  Stay at a healthy weight. Ask your doctor what weight is healthy for you. If you need to lose weight, work with your doctor to do so safely.  Exercise for at least 30 minutes on 5 or more days each week, or as told by your doctor.  Wear loose-fitting clothes.  Do not smoke or use any products that contain nicotine or tobacco. If you need help quitting, ask your doctor.  Sleep with the head of your bed higher than your feet. Use a wedge under the mattress or blocks under the bed frame to raise the head of the bed.  Chew sugar-free gum after meals. What foods should eat? Eat a healthy, well-balanced diet of fruits, vegetables, whole grains, low-fat dairy products, lean meats, fish, and poultry. Each person is different. Foods that may cause symptoms in one person may not cause any symptoms in another person. Work with your doctor to find foods that are safe for you. The items listed above may not be a complete list of what you can eat and drink. Contact a food expert for more options.   What foods should I avoid? Limiting some of these foods may help in managing the symptoms of GERD. Everyone is different. Talk with a food expert or your doctor to help you find the exact foods to avoid, if  any. Fruits Any fruits prepared with added fat. Any fruits that cause symptoms. For some people, this may include citrus fruits, such as oranges, grapefruit, pineapple, and lemons. Vegetables Deep-fried vegetables. [place] fries. Any vegetables prepared with added fat. Any vegetables that cause symptoms. For some people, this may include tomatoes and tomato products, chili peppers, onions and garlic, and horseradish. Grains Pastries or quick breads with added fat. Meats and other proteins High-fat meats, such as fatty beef or pork, hot dogs, ribs, ham, sausage, salami, and bacon. Fried meat or protein, including fried fish and fried chicken. Nuts and nut butters, in large amounts. Dairy Whole milk and chocolate milk. Sour cream. Cream. Ice cream. Cream cheese. Milkshakes. Fats and oils Butter. Margarine. Shortening. Ghee. Beverages Coffee and tea, with or without caffeine. Carbonated beverages. Sodas. Energy drinks. Fruit juice made with acidic fruits, such as orange or grapefruit. Tomato juice. Alcoholic drinks. Sweets and desserts Chocolate and cocoa. Donuts. Seasonings and condiments Pepper. Peppermint and spearmint. Added salt. Any condiments, herbs, or seasonings that cause symptoms. For some people, this may include curry, hot sauce, or vinegar-based salad dressings. The items listed above may not be a complete list of what you should not eat and drink. Contact a food expert for more options. Questions to ask your doctor Diet and lifestyle changes are often the first steps that are taken to manage symptoms of GERD. If diet and lifestyle changes do not help, talk with your doctor about taking medicines. Where to find more information  International Foundation for Gastrointestinal Disorders: aboutgerd.org Summary  When you have GERD, food and lifestyle choices are very important in easing your symptoms.  Eat small meals often instead of 3 large meals a day. Eat your meals slowly and  in a place where you are relaxed.  Avoid bending over or lying down until 2-3 hours after eating.  Limit high-fat foods such as fatty meats or fried foods. This information is not intended to replace advice given to you by your health care provider. Make sure you discuss any questions you have with your health care provider. Document Revised: 01/27/2020 Document Reviewed: 01/27/2020 Elsevier Patient Education  2021 [employer].

## 2020-10-09 LAB — H. PYLORI BREATH TEST: H. pylori Breath Test: DETECTED — AB

## 2020-10-12 ENCOUNTER — Other Ambulatory Visit: Payer: Self-pay | Admitting: Family Medicine

## 2020-10-12 ENCOUNTER — Telehealth: Payer: Self-pay

## 2020-10-12 DIAGNOSIS — K2970 Gastritis, unspecified, without bleeding: Secondary | ICD-10-CM

## 2020-10-12 DIAGNOSIS — B9681 Helicobacter pylori [H. pylori] as the cause of diseases classified elsewhere: Secondary | ICD-10-CM

## 2020-10-12 MED ORDER — AMOXICILLIN 500 MG PO CAPS: 1000.0000 mg | ORAL_CAPSULE | Freq: Two times a day (BID) | ORAL | 0 refills | Status: DC

## 2020-10-12 MED ORDER — CLARITHROMYCIN 500 MG PO TABS: 500.0000 mg | ORAL_TABLET | Freq: Two times a day (BID) | ORAL | 0 refills | Status: DC

## 2020-10-12 MED ORDER — PANTOPRAZOLE SODIUM 40 MG PO TBEC: 40.0000 mg | DELAYED_RELEASE_TABLET | Freq: Two times a day (BID) | ORAL | 0 refills | Status: DC

## 2020-10-12 NOTE — Telephone Encounter (Signed)
Patient would like to have medications sent to [id number] - [place], [place] - [address] instead of the [facility] they were sent to originally  pantoprazole (PROTONIX) 40 MG tablet clarithromycin (BIAXIN) 500 MG tablet amoxicillin (AMOXIL) 500 MG capsule

## 2020-10-14 ENCOUNTER — Encounter: Payer: Self-pay | Admitting: Family Medicine

## 2020-10-14 DIAGNOSIS — M5430 Sciatica, unspecified side: Secondary | ICD-10-CM

## 2020-11-04 ENCOUNTER — Other Ambulatory Visit: Payer: Medicare Other

## 2020-11-04 ENCOUNTER — Other Ambulatory Visit: Payer: Self-pay | Admitting: Family Medicine

## 2020-11-04 DIAGNOSIS — B9681 Helicobacter pylori [H. pylori] as the cause of diseases classified elsewhere: Secondary | ICD-10-CM

## 2020-11-11 ENCOUNTER — Ambulatory Visit (INDEPENDENT_AMBULATORY_CARE_PROVIDER_SITE_OTHER)
Admission: RE | Admit: 2020-11-11 | Discharge: 2020-11-11 | Disposition: A | Discharge status: Home / Self Care | Payer: Self-pay | Source: Ambulatory Visit | Attending: Family Medicine | Admitting: Family Medicine

## 2020-11-11 DIAGNOSIS — E782 Mixed hyperlipidemia: Secondary | ICD-10-CM

## 2020-11-18 ENCOUNTER — Encounter: Payer: Self-pay | Admitting: Family Medicine

## 2020-11-18 ENCOUNTER — Other Ambulatory Visit: Payer: Self-pay | Admitting: Family Medicine

## 2020-11-18 DIAGNOSIS — E782 Mixed hyperlipidemia: Secondary | ICD-10-CM

## 2020-11-18 MED ORDER — ROSUVASTATIN CALCIUM 10 MG PO TABS: 10.0000 mg | ORAL_TABLET | Freq: Every day | ORAL | 3 refills | Status: DC

## 2020-11-27 MED ORDER — ROSUVASTATIN CALCIUM 10 MG PO TABS: 10.0000 mg | ORAL_TABLET | Freq: Every day | ORAL | 3 refills | Status: DC

## 2021-04-14 ENCOUNTER — Encounter: Payer: Self-pay | Admitting: Physician Assistant

## 2021-04-14 ENCOUNTER — Other Ambulatory Visit: Payer: Self-pay

## 2021-04-14 ENCOUNTER — Ambulatory Visit (INDEPENDENT_AMBULATORY_CARE_PROVIDER_SITE_OTHER): Payer: Medicare Other | Admitting: Physician Assistant

## 2021-04-14 VITALS — BP 115/71 | HR 84 | Temp 97.7°F | Ht 63.25 in | Wt 158.6 lb

## 2021-04-14 DIAGNOSIS — R079 Chest pain, unspecified: Secondary | ICD-10-CM | POA: Diagnosis not present

## 2021-04-14 DIAGNOSIS — B9681 Helicobacter pylori [H. pylori] as the cause of diseases classified elsewhere: Secondary | ICD-10-CM

## 2021-04-14 DIAGNOSIS — K2970 Gastritis, unspecified, without bleeding: Secondary | ICD-10-CM | POA: Diagnosis not present

## 2021-04-14 MED ORDER — PANTOPRAZOLE SODIUM 40 MG PO TBEC: 40.0000 mg | DELAYED_RELEASE_TABLET | Freq: Every day | ORAL | 0 refills | Status: DC

## 2021-04-14 NOTE — Patient Instructions (Signed)
It was great to see you!  Recheck your H Pylori test today  Start daily protonix 40 mg daily  Referral to gastroenterology placed  If you develop new chest pain or other concerning symptoms, please call us or to go the ER  If a referral was placed today, you will be contacted for an appointment. Please note that routine referrals can sometimes take up to 3-4 weeks to process. Please call our office if you haven't heard anything after this time frame.  Take care,  Jarold Motto PA-C

## 2021-04-14 NOTE — Progress Notes (Signed)
Deanna Peters is a 65 y.o. female here for a follow up of a pre-existing problem.  History of Present Illness:   Chief Complaint  Patient presents with  . Referral    GI due to acid reflux  Pressure on chest area with breathing     HPI  Acid Reflux and Referral In March she tested positive for H Pylori. Took amoxicillin, clarithromycin and protonix as directed. Did not return for repeat testing to check for eradication.  She notes experiencing a burning sensation when eating certain foods, and can experience difficulty swallowing food. Things like Hamburger, breads can cause her this difficulty. Moreover, she states that the sensation of pain is similar to that of "cankersores down her throat".  Additionally, she also feels a pressure on her chest, which has stayed the same since the onset. Also, she admits that the sensation feels like it is behind the sternum and in the upper region.   Doing exercise does not increase the sensation of pressure, but it has coincided with an increase in measured heart rate. On 04/11/2021 she experienced vomiting after eating part of a muffin and some hot tea.   Since her visit with Dr. Artis Flock. She has not seen any blood in urine or change in stools.   Past Medical History:  Diagnosis Date  . Anxiety   . Chicken pox   . Hyperlipidemia   . Panic disorder      Social History   Tobacco Use  . Smoking status: Never  . Smokeless tobacco: Never  Vaping Use  . Vaping Use: Never used  Substance Use Topics  . Alcohol use: Not Currently    Alcohol/week: 0.0 standard drinks  . Drug use: No    Past Surgical History:  Procedure Laterality Date  . CARPAL TUNNEL RELEASE Right 01/28/2014   Procedure: RIGHT CARPAL TUNNEL RELEASE;  Surgeon: Wyn Forster, MD;  Location: [facility];  Service: Orthopedics;  Laterality: Right;  . LAPAROSCOPIC APPENDECTOMY N/A 10/30/2013   Procedure: APPENDECTOMY LAPAROSCOPIC;  Surgeon: Mariella Saa, MD;  Location: [facility];  Service: General;  Laterality: N/A;  . TONSILLECTOMY    . WISDOM TOOTH EXTRACTION  age 21    Family History  Problem Relation Age of Onset  . Hyperlipidemia Mother   . Heart disease Father   . GER disease Maternal Grandmother   . Heart disease Paternal Grandfather     Allergies  Allergen Reactions  . Nickel Itching and Swelling  . Sulfa Antibiotics Itching and Rash    Current Medications:   Current Outpatient Medications:  .  buPROPion (WELLBUTRIN XL) 300 MG 24 hr tablet, Take 300 mg by mouth daily., Disp: , Rfl:  .  pantoprazole (PROTONIX) 40 MG tablet, Take 1 tablet (40 mg total) by mouth daily., Disp: 90 tablet, Rfl: 0 .  rosuvastatin (CRESTOR) 10 MG tablet, Take 1 tablet (10 mg total) by mouth daily., Disp: 90 tablet, Rfl: 3 .  sertraline (ZOLOFT) 100 MG tablet, Take 100 mg by mouth daily., Disp: , Rfl:    Review of Systems:   Review of Systems  Constitutional:  Negative for chills and fever.  Gastrointestinal:  Positive for heartburn and vomiting. Negative for abdominal pain, blood in stool, constipation, diarrhea, melena and nausea.  Musculoskeletal:  Positive for back pain.  Psychiatric/Behavioral:  The patient is nervous/anxious.    Vitals:   Vitals:   04/14/21 1436  BP: 115/71  Pulse: 84  Temp: 97.7 F (36.5  C)  TempSrc: Temporal  SpO2: 95%  Weight: 158 lb 9.6 oz (71.9 kg)  Height: 5' 3.25" (1.607 m)     Body mass index is 27.87 kg/m.  Physical Exam:   Physical Exam Vitals and nursing note reviewed.  Constitutional:      General: She is not in acute distress.    Appearance: She is well-developed. She is not ill-appearing or toxic-appearing.  Cardiovascular:     Rate and Rhythm: Normal rate and regular rhythm.     Pulses: Normal pulses.     Heart sounds: Normal heart sounds, S1 normal and S2 normal.     Comments: No LE edema Pulmonary:     Effort: Pulmonary effort is normal.     Breath sounds: Normal breath  sounds.  Abdominal:     General: Abdomen is flat. Bowel sounds are normal.     Palpations: Abdomen is soft.     Tenderness: There is no abdominal tenderness.  Skin:    General: Skin is warm and dry.  Neurological:     Mental Status: She is alert.     GCS: GCS eye subscore is 4. GCS verbal subscore is 5. GCS motor subscore is 6.  Psychiatric:        Speech: Speech normal.        Behavior: Behavior normal. Behavior is cooperative.    Assessment and Plan:   1. Chest pain, unspecified type EKG tracing is personally reviewed.  EKG notes NSR.  No acute changes.  No evidence of exertional component of symptoms, suspect possible untreated GERD or incomplete eradication of H Pylori If new/worsening symptoms, recommend ER evaluation  2. Gastritis, Helicobacter pylori Referral to GI for likely EGD for evaluation of dysphagia and GERD Repeat H Pylori test today will treat accordingly Start protonix 40 mg daily Worsening precautions in the meantime, recommend reach out to [place]   I, Jarold Motto, PA, have reviewed all documentation for this visit. The documentation on 04/14/21 for the exam, diagnosis, procedures, and orders are all accurate and complete.   Jarold Motto, PA-C

## 2021-04-15 LAB — H. PYLORI BREATH TEST: H. pylori Breath Test: NOT DETECTED

## 2021-06-29 ENCOUNTER — Ambulatory Visit: Payer: Medicare Other

## 2021-07-08 ENCOUNTER — Ambulatory Visit (INDEPENDENT_AMBULATORY_CARE_PROVIDER_SITE_OTHER): Payer: Medicare Other

## 2021-07-08 DIAGNOSIS — Z23 Encounter for immunization: Secondary | ICD-10-CM | POA: Diagnosis not present

## 2021-07-10 ENCOUNTER — Other Ambulatory Visit: Payer: Self-pay | Admitting: Physician Assistant

## 2021-07-13 ENCOUNTER — Encounter: Payer: Self-pay | Admitting: Physician Assistant

## 2021-07-13 ENCOUNTER — Other Ambulatory Visit: Payer: Self-pay

## 2021-07-13 ENCOUNTER — Ambulatory Visit (INDEPENDENT_AMBULATORY_CARE_PROVIDER_SITE_OTHER): Payer: Medicare Other | Admitting: Physician Assistant

## 2021-07-13 VITALS — BP 118/74 | HR 73 | Temp 97.6°F | Ht 63.0 in | Wt 157.0 lb

## 2021-07-13 DIAGNOSIS — E782 Mixed hyperlipidemia: Secondary | ICD-10-CM | POA: Diagnosis not present

## 2021-07-13 DIAGNOSIS — K219 Gastro-esophageal reflux disease without esophagitis: Secondary | ICD-10-CM

## 2021-07-13 DIAGNOSIS — G2581 Restless legs syndrome: Secondary | ICD-10-CM

## 2021-07-13 MED ORDER — PANTOPRAZOLE SODIUM 40 MG PO TBEC: 40.0000 mg | DELAYED_RELEASE_TABLET | Freq: Every day | ORAL | 1 refills | Status: DC

## 2021-07-13 MED ORDER — ROPINIROLE HCL 0.25 MG PO TABS: 0.2500 mg | ORAL_TABLET | Freq: Every day | ORAL | 1 refills | Status: DC

## 2021-07-13 MED ORDER — ROSUVASTATIN CALCIUM 10 MG PO TABS: 10.0000 mg | ORAL_TABLET | Freq: Every day | ORAL | 1 refills | Status: DC

## 2021-07-13 NOTE — Patient Instructions (Signed)
It was great to see you!  Start requip nightly, 0.25 mg dosage After 3 days, may increase to 0.5 mg May continue to increase every 3 days to max dose of 1 mg  Crestor and Protonix refilled  Let's follow-up in 3 months, sooner if you have concerns.  Take care,  Jarold Motto PA-C

## 2021-07-13 NOTE — Progress Notes (Signed)
Deanna Peters is a 65 y.o. female here for a follow up referred by former PCP, Dr. Artis Flock.   History of Present Illness:   Chief Complaint  Patient presents with   transfer of care    Pt came to transfer care; Pt needs refills on Crestor and Protonix     HPI  HLD Deanna Peters is currently non-compliant with crestor 10 mg daily due to feeling as though she was on too many medications. Upon further explanation on why the medication is deemed necessary, she is in agreement to taking the medication as instructed. She had calcium score by prior provider -- we reviewed these results in the office again today.  11/11/2020 IMPRESSION: Coronary calcium score of 14. This was 58 percentile for age-, race-, and sex-matched controls.  GERD Pt has been compliant with taking protonix 40 mg daily with no adverse effects. She has found this to be very beneficial and will need a refill.   Restless Legs Deanna Peters does admit she has been having restless legs for years and is experiencing them while sitting during our visit. She is not completely sure she is maintaining a well balanced diet but has not experienced any sx of low iron. She brought this up to her prior PCP a few years ago and ferritin level was checked and was 99. She was started on Requip but she doesn't remember taking this medication. She would like to trial this again.     Past Medical History:  Diagnosis Date   Anxiety    Chicken pox    Hyperlipidemia    Panic disorder      Social History   Tobacco Use   Smoking status: Never   Smokeless tobacco: Never  Vaping Use   Vaping Use: Never used  Substance Use Topics   Alcohol use: Not Currently    Alcohol/week: 0.0 standard drinks   Drug use: No    Past Surgical History:  Procedure Laterality Date   CARPAL TUNNEL RELEASE Right 01/28/2014   Procedure: RIGHT CARPAL TUNNEL RELEASE;  Surgeon: Wyn Forster, MD;  Location: [facility];  Service: Orthopedics;   Laterality: Right;   LAPAROSCOPIC APPENDECTOMY N/A 10/30/2013   Procedure: APPENDECTOMY LAPAROSCOPIC;  Surgeon: Mariella Saa, MD;  Location: [facility];  Service: General;  Laterality: N/A;   TONSILLECTOMY     WISDOM TOOTH EXTRACTION  age 88    Family History  Problem Relation Age of Onset   Hyperlipidemia Mother    Heart disease Father    GER disease Maternal Grandmother    Heart disease Paternal Grandfather     Allergies  Allergen Reactions   Nickel Itching and Swelling   Sulfa Antibiotics Itching and Rash    Current Medications:   Current Outpatient Medications:    buPROPion (WELLBUTRIN XL) 300 MG 24 hr tablet, Take 300 mg by mouth daily., Disp: , Rfl:    pantoprazole (PROTONIX) 40 MG tablet, TAKE 1 TABLET BY MOUTH EVERY DAY, Disp: 90 tablet, Rfl: 0   rosuvastatin (CRESTOR) 10 MG tablet, Take 1 tablet (10 mg total) by mouth daily., Disp: 90 tablet, Rfl: 3   sertraline (ZOLOFT) 100 MG tablet, Take 100 mg by mouth daily., Disp: , Rfl:    Review of Systems:   ROS Negative unless otherwise specified per HPI. Vitals:   Vitals:   07/13/21 1431  BP: 118/74  Pulse: 73  Temp: 97.6 F (36.4 C)  TempSrc: Temporal  SpO2: 97%  Weight: 157 lb (71.2  kg)  Height: 5\' 3"  (1.6 m)     Body mass index is 27.81 kg/m.  Physical Exam:   Physical Exam Vitals and nursing note reviewed.  Constitutional:      General: She is not in acute distress.    Appearance: She is well-developed. She is not ill-appearing or toxic-appearing.  Cardiovascular:     Rate and Rhythm: Normal rate and regular rhythm.     Pulses: Normal pulses.     Heart sounds: Normal heart sounds, S1 normal and S2 normal.  Pulmonary:     Effort: Pulmonary effort is normal.     Breath sounds: Normal breath sounds.  Skin:    General: Skin is warm and dry.  Neurological:     Mental Status: She is alert.     GCS: GCS eye subscore is 4. GCS verbal subscore is 5. GCS motor subscore is 6.  Psychiatric:         Speech: Speech normal.        Behavior: Behavior normal. Behavior is cooperative.    Assessment and Plan:   Mixed Hyperlipidemia Uncontrolled Start Crestor 10 mg daily I advised patient to remain compliant with medication due to their increased risk of heart attack and stroke, patient verbalized understanding Follow up in 3 months, sooner if concerns occur-- will update blood work at that time  GERD Well controlled Continue protonix 40 mg daily as needed  Refill Protonix x 3 months Recommended maintaining a healthy diet to improve symptoms  Restless Leg Syndrome Uncontrolled Last ferritin WNL; start Requip 0.25 mg daily, after 3 days may increase to 0.5 mg  Informed patient that if comfortable, may increase Requip every 3 days by 0.25 mg to max dose of 1 mg daily  I,Havlyn C Ratchford,acting as a for Neurosurgeon, PA.,have documented all relevant documentation on the behalf of [employer], PA,as directed by  Jarold Motto, PA while in the presence of Jarold Motto, Jarold Motto.  I, [place], Jarold Motto, have reviewed all documentation for this visit. The documentation on 07/13/21 for the exam, diagnosis, procedures, and orders are all accurate and complete.   07/15/21, PA-C

## 2021-08-05 ENCOUNTER — Other Ambulatory Visit: Payer: Self-pay | Admitting: Physician Assistant

## 2021-09-01 ENCOUNTER — Other Ambulatory Visit: Payer: Self-pay | Admitting: Physician Assistant

## 2021-09-01 NOTE — Telephone Encounter (Signed)
Pt requesting refill on Requip 0.25 mg. Last OV 07/13/2021.

## 2021-09-09 ENCOUNTER — Telehealth: Payer: Self-pay | Admitting: Physician Assistant

## 2021-09-09 NOTE — Telephone Encounter (Signed)
Copied from CRM [id number]. Topic: Medicare AWV >> Sep 09, 2021 10:16 AM Harris-Coley, Avon Gully wrote: Reason for CRM: Left message for patient to schedule Annual Wellness Visit.  Please schedule with Nurse Health Advisor Lanier Ensign, RN at [facility].  Please call [phone] ask for [facility]

## 2021-10-01 ENCOUNTER — Other Ambulatory Visit: Payer: Self-pay | Admitting: Physician Assistant

## 2021-10-25 ENCOUNTER — Other Ambulatory Visit: Payer: Self-pay | Admitting: Physician Assistant

## 2021-11-21 ENCOUNTER — Other Ambulatory Visit: Payer: Self-pay | Admitting: Physician Assistant

## 2021-12-16 ENCOUNTER — Ambulatory Visit (INDEPENDENT_AMBULATORY_CARE_PROVIDER_SITE_OTHER): Payer: Medicare Other

## 2021-12-16 DIAGNOSIS — Z0000 Encounter for general adult medical examination without abnormal findings: Secondary | ICD-10-CM | POA: Diagnosis not present

## 2021-12-16 NOTE — Patient Instructions (Signed)
Deanna Peters , Thank you for taking time to come for your Medicare Wellness Visit. I appreciate your ongoing commitment to your health goals. Please review the following plan we discussed and let me know if I can assist you in the future.   Screening recommendations/referrals: Colonoscopy: Done 11/06/14 repeat every 10 years  Mammogram: Done 08/30/21 repeat every year  Bone Density: Done 06/28/17 repeat every 3-5 years  Recommended yearly ophthalmology/optometry visit for glaucoma screening and checkup Recommended yearly dental visit for hygiene and checkup  Vaccinations: Influenza vaccine: Done 07/08/21 repeat every year  Pneumococcal vaccine: due  Tdap vaccine: Done 10/21/14 repeat every 10 years  Shingles vaccine: Shingrix discussed. Please contact your pharmacy for coverage information.    Covid-19:completed 07/29/19, 1/29, 06/20/20  Advanced directives: Please bring a copy of your health care power of attorney and living will to the office at your convenience.  Conditions/risks identified: none at this time   Next appointment: Follow up in one year for your annual wellness visit    Preventive Care 65 Years and Older, Female Preventive care refers to lifestyle choices and visits with your health care provider that can promote health and wellness. What does preventive care include? A yearly physical exam. This is also called an annual well check. Dental exams once or twice a year. Routine eye exams. Ask your health care provider how often you should have your eyes checked. Personal lifestyle choices, including: Daily care of your teeth and gums. Regular physical activity. Eating a healthy diet. Avoiding tobacco and drug use. Limiting alcohol use. Practicing safe sex. Taking low-dose aspirin every day. Taking vitamin and mineral supplements as recommended by your health care provider. What happens during an annual well check? The services and screenings done by your health care  provider during your annual well check will depend on your age, overall health, lifestyle risk factors, and family history of disease. Counseling  Your health care provider may ask you questions about your: Alcohol use. Tobacco use. Drug use. Emotional well-being. Home and relationship well-being. Sexual activity. Eating habits. History of falls. Memory and ability to understand (cognition). Work and work Astronomer. Reproductive health. Screening  You may have the following tests or measurements: Height, weight, and BMI. Blood pressure. Lipid and cholesterol levels. These may be checked every 5 years, or more frequently if you are over 66 years old. Skin check. Lung cancer screening. You may have this screening every year starting at age 50 if you have a 30-pack-year history of smoking and currently smoke or have quit within the past 15 years. Fecal occult blood test (FOBT) of the stool. You may have this test every year starting at age 30. Flexible sigmoidoscopy or colonoscopy. You may have a sigmoidoscopy every 5 years or a colonoscopy every 10 years starting at age 80. Hepatitis C blood test. Hepatitis B blood test. Sexually transmitted disease (STD) testing. Diabetes screening. This is done by checking your blood sugar (glucose) after you have not eaten for a while (fasting). You may have this done every 1-3 years. Bone density scan. This is done to screen for osteoporosis. You may have this done starting at age 55. Mammogram. This may be done every 1-2 years. Talk to your health care provider about how often you should have regular mammograms. Talk with your health care provider about your test results, treatment options, and if necessary, the need for more tests. Vaccines  Your health care provider may recommend certain vaccines, such as: Influenza vaccine. This is  recommended every year. Tetanus, diphtheria, and acellular pertussis (Tdap, Td) vaccine. You may need a Td  booster every 10 years. Zoster vaccine. You may need this after age 38. Pneumococcal 13-valent conjugate (PCV13) vaccine. One dose is recommended after age 74. Pneumococcal polysaccharide (PPSV23) vaccine. One dose is recommended after age 39. Talk to your health care provider about which screenings and vaccines you need and how often you need them. This information is not intended to replace advice given to you by your health care provider. Make sure you discuss any questions you have with your health care provider. Document Released: 08/14/2015 Document Revised: 04/06/2016 Document Reviewed: 05/19/2015 Elsevier Interactive Patient Education  2017 [place] Prevention in the Home Falls can cause injuries. They can happen to people of all ages. There are many things you can do to make your home safe and to help prevent falls. What can I do on the outside of my home? Regularly fix the edges of walkways and driveways and fix any cracks. Remove anything that might make you trip as you walk through a door, such as a raised step or threshold. Trim any bushes or trees on the path to your home. Use bright outdoor lighting. Clear any walking paths of anything that might make someone trip, such as rocks or tools. Regularly check to see if handrails are loose or broken. Make sure that both sides of any steps have handrails. Any raised decks and porches should have guardrails on the edges. Have any leaves, snow, or ice cleared regularly. Use sand or salt on walking paths during winter. Clean up any spills in your garage right away. This includes oil or grease spills. What can I do in the bathroom? Use night lights. Install grab bars by the toilet and in the tub and shower. Do not use towel bars as grab bars. Use non-skid mats or decals in the tub or shower. If you need to sit down in the shower, use a plastic, non-slip stool. Keep the floor dry. Clean up any water that spills on the floor  as soon as it happens. Remove soap buildup in the tub or shower regularly. Attach bath mats securely with double-sided non-slip rug tape. Do not have throw rugs and other things on the floor that can make you trip. What can I do in the bedroom? Use night lights. Make sure that you have a light by your bed that is easy to reach. Do not use any sheets or blankets that are too big for your bed. They should not hang down onto the floor. Have a firm chair that has side arms. You can use this for support while you get dressed. Do not have throw rugs and other things on the floor that can make you trip. What can I do in the kitchen? Clean up any spills right away. Avoid walking on wet floors. Keep items that you use a lot in easy-to-reach places. If you need to reach something above you, use a strong step stool that has a grab bar. Keep electrical cords out of the way. Do not use floor polish or wax that makes floors slippery. If you must use wax, use non-skid floor wax. Do not have throw rugs and other things on the floor that can make you trip. What can I do with my stairs? Do not leave any items on the stairs. Make sure that there are handrails on both sides of the stairs and use them. Fix handrails that are  broken or loose. Make sure that handrails are as long as the stairways. Check any carpeting to make sure that it is firmly attached to the stairs. Fix any carpet that is loose or worn. Avoid having throw rugs at the top or bottom of the stairs. If you do have throw rugs, attach them to the floor with carpet tape. Make sure that you have a light switch at the top of the stairs and the bottom of the stairs. If you do not have them, ask someone to add them for you. What else can I do to help prevent falls? Wear shoes that: Do not have high heels. Have rubber bottoms. Are comfortable and fit you well. Are closed at the toe. Do not wear sandals. If you use a stepladder: Make sure that it is  fully opened. Do not climb a closed stepladder. Make sure that both sides of the stepladder are locked into place. Ask someone to hold it for you, if possible. Clearly mark and make sure that you can see: Any grab bars or handrails. First and last steps. Where the edge of each step is. Use tools that help you move around (mobility aids) if they are needed. These include: Canes. Walkers. Scooters. Crutches. Turn on the lights when you go into a dark area. Replace any light bulbs as soon as they burn out. Set up your furniture so you have a clear path. Avoid moving your furniture around. If any of your floors are uneven, fix them. If there are any pets around you, be aware of where they are. Review your medicines with your doctor. Some medicines can make you feel dizzy. This can increase your chance of falling. Ask your doctor what other things that you can do to help prevent falls. This information is not intended to replace advice given to you by your health care provider. Make sure you discuss any questions you have with your health care provider. Document Released: 05/14/2009 Document Revised: 12/24/2015 Document Reviewed: 08/22/2014 Elsevier Interactive Patient Education  2017 [employer].

## 2021-12-16 NOTE — Progress Notes (Addendum)
Virtual Visit via Telephone Note  I connected with  CATIE CHIAO on 12/16/21 at 11:00 AM EDT by telephone and verified that I am speaking with the correct person using two identifiers.  Medicare Annual Wellness visit completed telephonically due to Covid-19 pandemic.   Persons participating in this call: This Health Coach and this patient.   Location: Patient: Home Provider: Office   I discussed the limitations, risks, security and privacy concerns of performing an evaluation and management service by telephone and the availability of in person appointments. The patient expressed understanding and agreed to proceed.  Unable to perform video visit due to video visit attempted and failed and/or patient does not have video capability.   Some vital signs may be absent or patient reported.   Marzella Schlein, LPN   Subjective:   DANAMARIE MINAMI is a 66 y.o. female who presents for an Initial Medicare Annual Wellness Visit.  Review of Systems     Cardiac Risk Factors include: advanced age (>86men, >65 women);dyslipidemia     Objective:    There were no vitals filed for this visit. There is no height or weight on file to calculate BMI.     12/16/2021   11:01 AM 01/22/2014   12:51 PM 10/31/2013    2:00 AM  Advanced Directives  Does Patient Have a Medical Advance Directive? Yes Patient has advance directive, copy not in chart Patient does not have advance directive  Type of Advance Directive Healthcare Power of [place];Living will    Copy of Healthcare Power of Attorney in Chart? No - copy requested    Pre-existing out of facility DNR order (yellow form or pink MOST form)   No    Current Medications (verified) Outpatient Encounter Medications as of 12/16/2021  Medication Sig   buPROPion (WELLBUTRIN XL) 300 MG 24 hr tablet Take 300 mg by mouth daily.   pantoprazole (PROTONIX) 40 MG tablet Take 1 tablet (40 mg total) by mouth daily.   rOPINIRole (REQUIP) 0.25 MG tablet TAKE 1  TABLET BY MOUTH EVERYDAY AT BEDTIME   rosuvastatin (CRESTOR) 10 MG tablet Take 1 tablet (10 mg total) by mouth daily.   sertraline (ZOLOFT) 100 MG tablet Take 100 mg by mouth daily.   No facility-administered encounter medications on file as of 12/16/2021.    Allergies (verified) Nickel and Sulfa antibiotics   History: Past Medical History:  Diagnosis Date   Anxiety    Chicken pox    Hyperlipidemia    Panic disorder    Past Surgical History:  Procedure Laterality Date   CARPAL TUNNEL RELEASE Right 01/28/2014   Procedure: RIGHT CARPAL TUNNEL RELEASE;  Surgeon: Wyn Forster, MD;  Location: [facility];  Service: Orthopedics;  Laterality: Right;   LAPAROSCOPIC APPENDECTOMY N/A 10/30/2013   Procedure: APPENDECTOMY LAPAROSCOPIC;  Surgeon: Mariella Saa, MD;  Location: [facility];  Service: General;  Laterality: N/A;   TONSILLECTOMY     WISDOM TOOTH EXTRACTION  age 21   Family History  Problem Relation Age of Onset   Hyperlipidemia Mother    Heart disease Father    GER disease Maternal Grandmother    Heart disease Paternal Grandfather    Social History   Socioeconomic History   Marital status: Married    Spouse name: Not on file   Number of children: 2   Years of education: Not on file   Highest education level: Not on file  Occupational History   Occupation: homemaker  Tobacco  Use   Smoking status: Never   Smokeless tobacco: Never  Vaping Use   Vaping Use: Never used  Substance and Sexual Activity   Alcohol use: Not Currently    Alcohol/week: 0.0 standard drinks   Drug use: No   Sexual activity: Yes    Partners: Male    Birth control/protection: Post-menopausal  Other Topics Concern   Not on file  Social History Narrative   Not on file   Social Determinants of Health   Financial Resource Strain: Low Risk    Difficulty of Paying Living Expenses: Not hard at all  Food Insecurity: No Food Insecurity   Worried About Programme researcher, broadcasting/film/videounning Out of Food in the  Last Year: Never true   Ran Out of Food in the Last Year: Never true  Transportation Needs: No Transportation Needs   Lack of Transportation (Medical): No   Lack of Transportation (Non-Medical): No  Physical Activity: Sufficiently Active   Days of Exercise per Week: 4 days   Minutes of Exercise per Session: 90 min  Stress: No Stress Concern Present   Feeling of Stress : Not at all  Social Connections: Moderately Integrated   Frequency of Communication with Friends and Family: More than three times a week   Frequency of Social Gatherings with Friends and Family: More than three times a week   Attends Religious Services: Never   Database administratorActive Member of Clubs or Organizations: Yes   Attends Engineer, structuralClub or Organization Meetings: 1 to 4 times per year   Marital Status: Married    Tobacco Counseling Counseling given: Not Answered   Clinical Intake:  Pre-visit preparation completed: Yes  Pain : No/denies pain     BMI - recorded: 27.82 Nutritional Status: BMI 25 -29 Overweight Nutritional Risks: None Diabetes: No  How often do you need to have someone help you when you read instructions, pamphlets, or other written materials from your doctor or pharmacy?: 1 - Never  Diabetic?no  Interpreter Needed?: No  Information entered by :: Lanier Ensignina Florena Kozma, LPN   Activities of Daily Living    12/16/2021   11:03 AM  In your present state of health, do you have any difficulty performing the following activities:  Hearing? 1  Comment slight loss  Vision? 0  Difficulty concentrating or making decisions? 0  Walking or climbing stairs? 0  Dressing or bathing? 0  Doing errands, shopping? 0  Preparing Food and eating ? N  Using the Toilet? N  In the past six months, have you accidently leaked urine? N  Do you have problems with loss of bowel control? N  Managing your Medications? N  Managing your Finances? N  Housekeeping or managing your Housekeeping? N    Patient Care Team: Jarold MottoWorley, Samantha,  [place] as PCP - General (Physician Assistant) Romualdo BolkJertson, Jill Evelyn, MD as Consulting Physician (Obstetrics and Gynecology)  Indicate any recent Medical Services you may have received from other than [facility] providers in the past year (date may be approximate).     Assessment:   This is a routine wellness examination for Mckenzie.  Hearing/Vision screen Hearing Screening - Comments:: Pt stated slight loss  Vision Screening - Comments:: [facility]   Dietary issues and exercise activities discussed: Current Exercise Habits: Home exercise routine, Type of exercise: Other - see comments (pickle ball), Time (Minutes): > 60, Frequency (Times/Week): 4, Weekly Exercise (Minutes/Week): 0   Goals Addressed  This Visit's Progress    Patient Stated       None at this time        Depression Screen    12/16/2021   11:01 AM 04/14/2021    2:33 PM  PHQ 2/9 Scores  PHQ - 2 Score 0 0    Fall Risk    12/16/2021   11:02 AM 04/14/2021    2:33 PM  Fall Risk   Falls in the past year? 0 0  Number falls in past yr: 0 0  Injury with Fall? 0 0  Risk for fall due to : Impaired vision No Fall Risks  Follow up Falls prevention discussed     FALL RISK PREVENTION PERTAINING TO THE HOME:  Any stairs in or around the home? Yes  If so, are there any without handrails? No  Home free of loose throw rugs in walkways, pet beds, electrical cords, etc? Yes  Adequate lighting in your home to reduce risk of falls? Yes   ASSISTIVE DEVICES UTILIZED TO PREVENT FALLS:  Life alert? No  Use of a cane, walker or w/c? No  Grab bars in the bathroom? No  Shower chair or bench in shower? Yes  Elevated toilet seat or a handicapped toilet? No   TIMED UP AND GO:  Was the test performed? No .   Cognitive Function:        12/16/2021   11:04 AM  6CIT Screen  What Year? 0 points  What month? 0 points  What time? 0 points  Count back from 20 0 points  Months in  reverse 0 points  Repeat phrase 0 points  Total Score 0 points    Immunizations Immunization History  Administered Date(s) Administered   Fluad Quad(high Dose 65+) 07/08/2021   Influenza-Unspecified 05/02/2015   Moderna Covid-19 Vaccine Bivalent Booster 58yrs & up 07/29/2019, 08/30/2019   Moderna SARS-COV2 Booster Vaccination 06/20/2020   Pneumococcal Polysaccharide-23 10/08/2020   Tdap 09/20/2013   Zoster, Live 08/01/2014    TDAP status: Up to date  Flu Vaccine status: Up to date  Pneumococcal vaccine status: Due, Education has been provided regarding the importance of this vaccine. Advised may receive this vaccine at local pharmacy or Health Dept. Aware to provide a copy of the vaccination record if obtained from local pharmacy or Health Dept. Verbalized acceptance and understanding.  Covid-19 vaccine status: Completed vaccines  Qualifies for Shingles Vaccine? Yes   Zostavax completed No   Shingrix Completed?: No.    Education has been provided regarding the importance of this vaccine. Patient has been advised to call insurance company to determine out of pocket expense if they have not yet received this vaccine. Advised may also receive vaccine at local pharmacy or Health Dept. Verbalized acceptance and understanding.  Screening Tests Health Maintenance  Topic Date Due   Zoster Vaccines- Shingrix (1 of 2) Never done   COVID-19 Vaccine (1) 08/30/2019   Pneumonia Vaccine 95+ Years old (2 - PCV) 10/08/2021   INFLUENZA VACCINE  03/01/2022   DEXA SCAN  06/28/2022   MAMMOGRAM  08/24/2022   TETANUS/TDAP  09/21/2023   COLONOSCOPY (Pts 45-62yrs Insurance coverage will need to be confirmed)  11/05/2024   Hepatitis C Screening  Completed   HPV VACCINES  Aged Out    Health Maintenance  Health Maintenance Due  Topic Date Due   Zoster Vaccines- Shingrix (1 of 2) Never done   COVID-19 Vaccine (1) 08/30/2019   Pneumonia Vaccine 66+ Years old (2 - PCV)  10/08/2021    Colorectal  cancer screening: Type of screening: Colonoscopy. Completed 11/06/14. Repeat every 10 years  Mammogram status: Completed 08/24/20. Repeat every year  Bone Density status: Completed 06/28/17. Results reflect: Bone density results: NORMAL. Repeat every 3-5 years.   Additional Screening:  Hepatitis C Screening:  Completed 10/14/15  Vision Screening: Recommended annual ophthalmology exams for early detection of glaucoma and other disorders of the eye. Is the patient up to date with their annual eye exam?  Yes  Who is the provider or what is the name of the office in which the patient attends annual eye exams? [facility]  If pt is not established with a provider, would they like to be referred to a provider to establish care? No .   Dental Screening: Recommended annual dental exams for proper oral hygiene  Community Resource Referral / Chronic Care Management: CRR required this visit?  No   CCM required this visit?  No      Plan:     I have personally reviewed and noted the following in the patient's chart:   Medical and social history Use of alcohol, tobacco or illicit drugs  Current medications and supplements including opioid prescriptions. Patient is not currently taking opioid prescriptions. Functional ability and status Nutritional status Physical activity Advanced directives List of other physicians Hospitalizations, surgeries, and ER visits in previous 12 months Vitals Screenings to include cognitive, depression, and falls Referrals and appointments  In addition, I have reviewed and discussed with patient certain preventive protocols, quality metrics, and best practice recommendations. A written personalized care plan for preventive services as well as general preventive health recommendations were provided to patient.     Marzella Schlein, LPN   1/61/0960   Nurse Notes: None

## 2021-12-17 ENCOUNTER — Other Ambulatory Visit: Payer: Self-pay | Admitting: Physician Assistant

## 2022-01-05 ENCOUNTER — Other Ambulatory Visit: Payer: Self-pay | Admitting: Physician Assistant

## 2022-01-13 ENCOUNTER — Other Ambulatory Visit: Payer: Self-pay | Admitting: Physician Assistant

## 2022-01-30 IMAGING — CT CT CARDIAC CORONARY ARTERY CALCIUM SCORE
3 series · 14 of 20 positions shown, 15 images · non-contrast
Comparison: None.
COMPARISON: None.

Addendum:
EXAM:
OVER-READ INTERPRETATION  CT CHEST

The following report is an over-read performed by radiologist Dr.
Amnon Tiger [REDACTED] on 11/11/2020. This
over-read does not include interpretation of cardiac or coronary
anatomy or pathology. The coronary calcium score interpretation by
the cardiologist is attached.
CLINICAL DATA: Cardiovascular Disease Risk stratification
Coronary Calcium Score
TECHNIQUE: A gated, non-contrast computed tomography scan of the heart was
performed using 3mm slice thickness. Axial images were analyzed on a
dedicated workstation. Calcium scoring of the coronary arteries was
performed using the Agatston method.

[Series 2: casc 3.0 bv41 2 bestdiast 75 % · axial · 0.38mm/px · z∈[-218,-148]mm · 4 of 39 slices shown, 5 images]
[im 8/39  vessel]
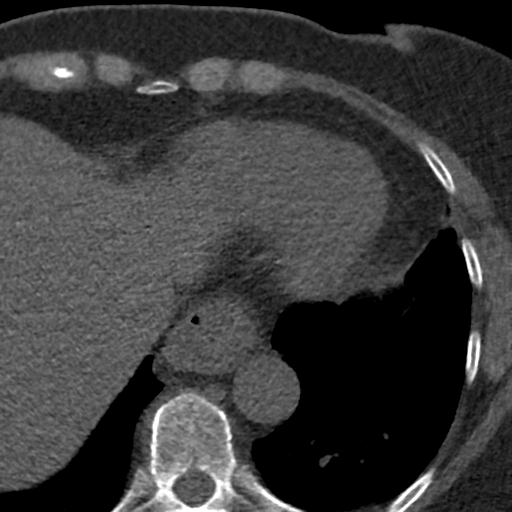
[im 8/39  lung]
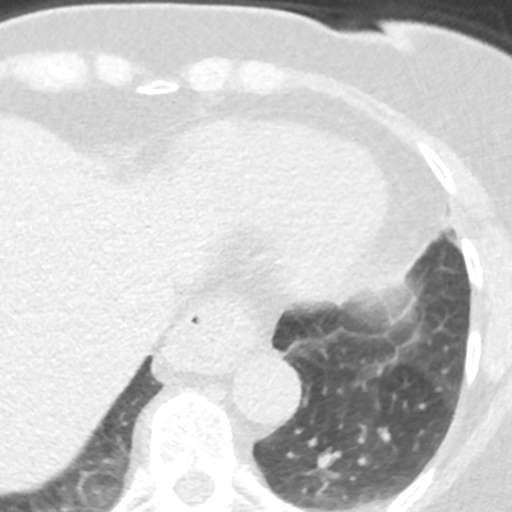
[im 16/39  vessel]
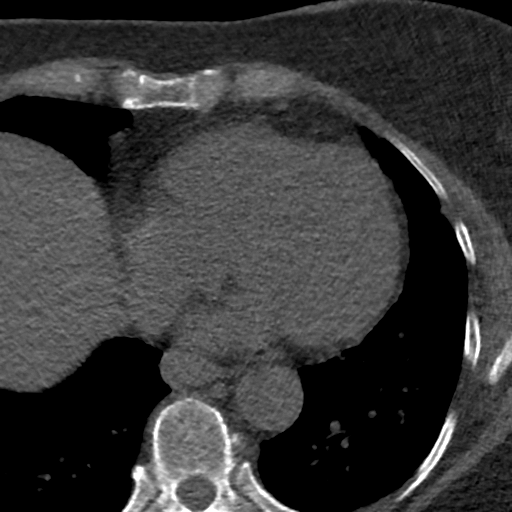
[im 23/39  vessel]
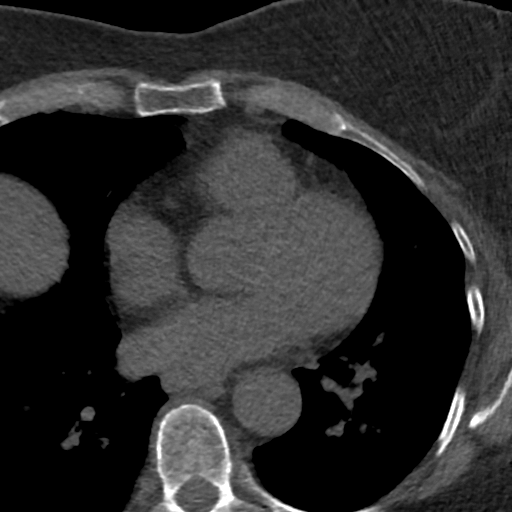
[im 31/39  vessel]
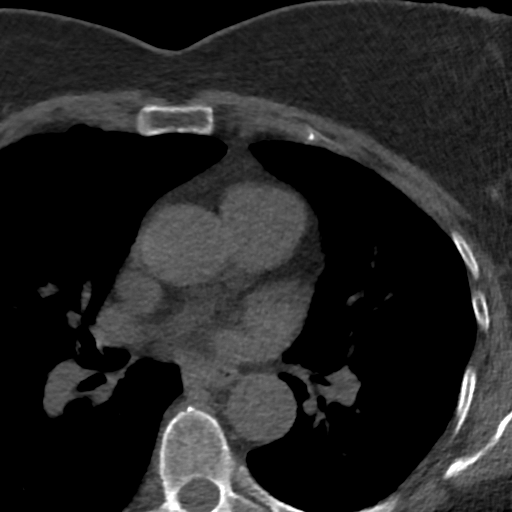

[Series 3: lung 73 % · axial · 0.68mm/px · z∈[-220,-146]mm · 5 of 39 slices shown]
[im 7/39  lung]
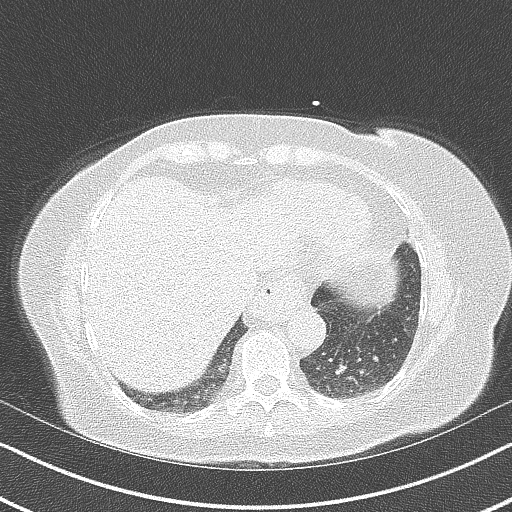
[im 13/39  lung]
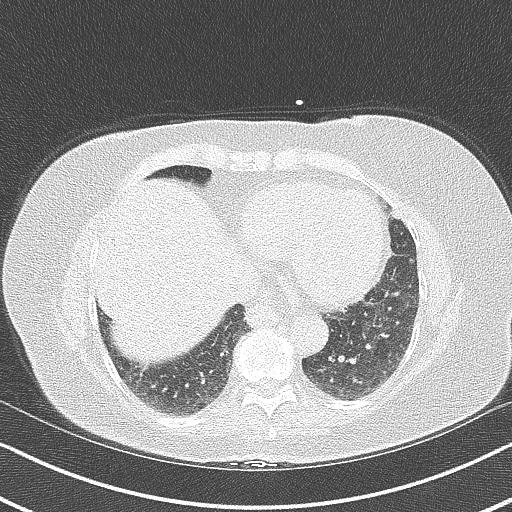
[im 20/39  lung]
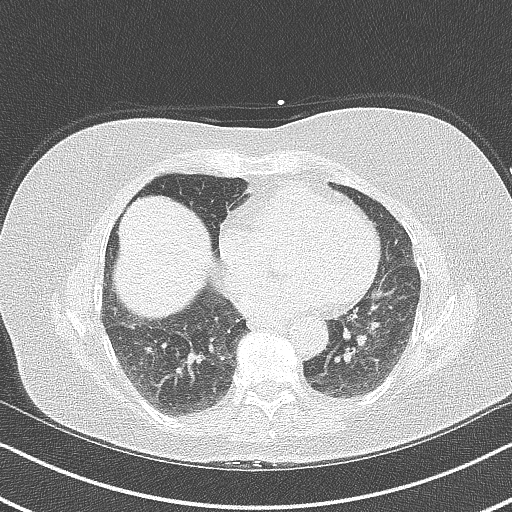
[im 26/39  lung]
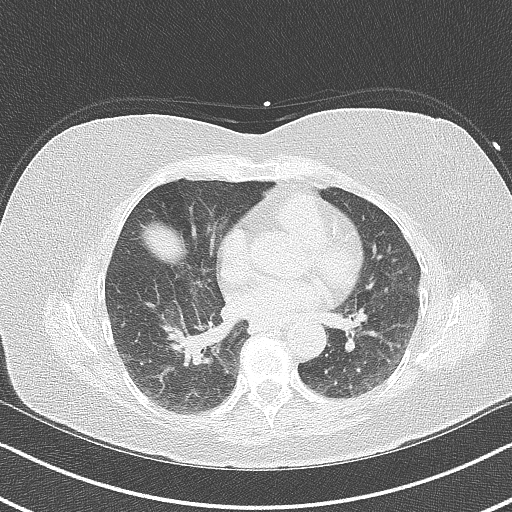
[im 32/39  lung]
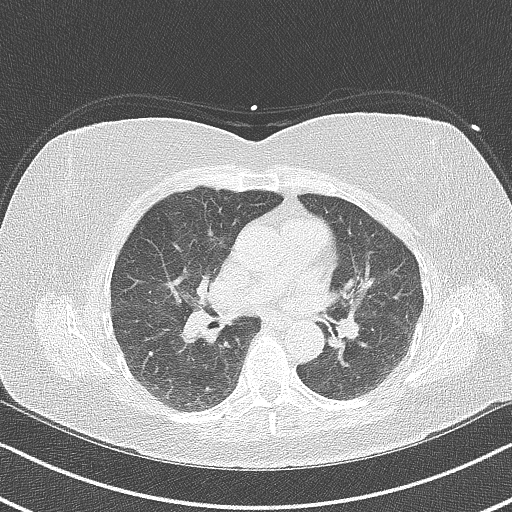

[Series 4: lung st 73 % · axial · 0.68mm/px · z∈[-220,-146]mm · 5 of 39 slices shown]
[im 7/39  lung]
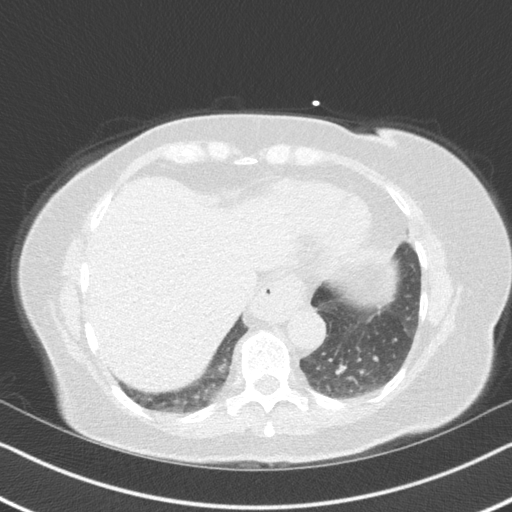
[im 13/39  lung]
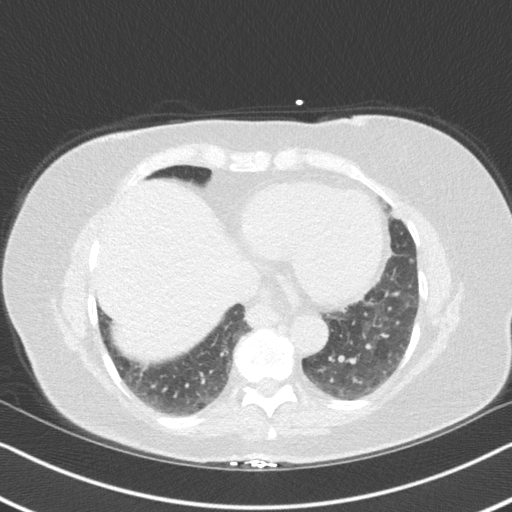
[im 20/39  lung]
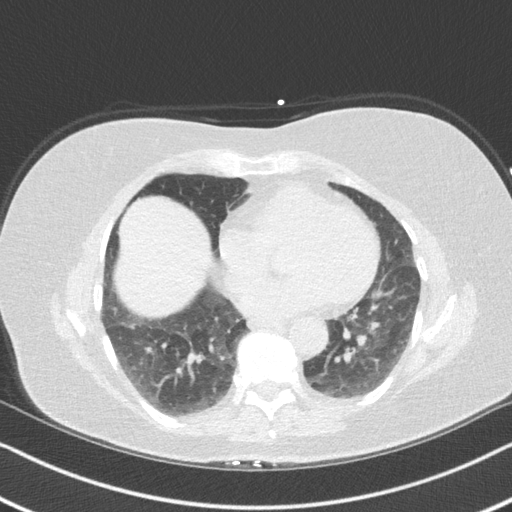
[im 26/39  lung]
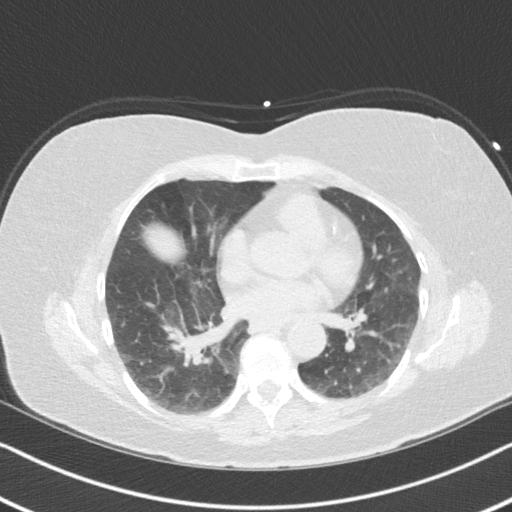
[im 32/39  lung]
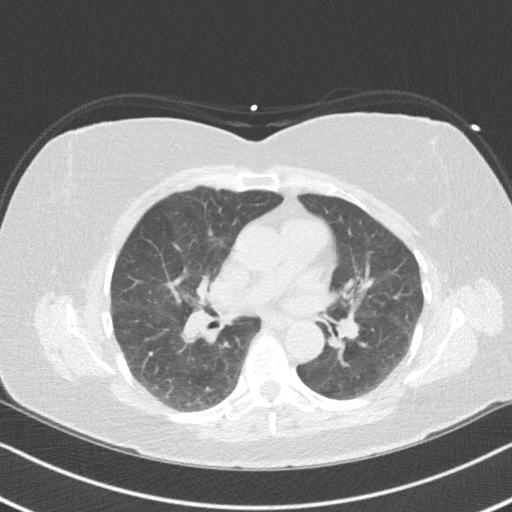

[14 of 20 positions shown; findings below may reference images not displayed]

FINDINGS: Moderate-sized hiatal hernia. Within the visualized portions of the
thorax there are no suspicious appearing pulmonary nodules or
masses, there is no acute consolidative airspace disease, no pleural
effusions, no pneumothorax and no lymphadenopathy. Visualized
portions of the upper abdomen are unremarkable. There are no
aggressive appearing lytic or blastic lesions noted in the
visualized portions of the skeleton.
IMPRESSION: 1. Moderate-sized hiatal hernia.
FINDINGS: Coronary arteries: Normal origins.

Coronary Calcium Score:

Left main: 0

Left anterior descending artery: 14

Left circumflex artery: 0

Right coronary artery: 0

Total: 14

Percentile: 36

Pericardium: Normal.

Aorta: Normal caliber of ascending aorta. No aortic atherosclerosis
noted.

Non-cardiac: See separate report from [REDACTED].
IMPRESSION: Coronary calcium score of 14. This was 36 percentile for age-,
race-, and sex-matched controls.



If CAC=0, it is reasonable to withhold statin therapy and reassess
in 5 to 10 years, as long as higher risk conditions are absent
(diabetes mellitus, family history of premature CHD in first degree
relatives (males <55 years; females <65 years), cigarette smoking,
or LDL >=190 mg/dL).

If CAC is 1 to 99, it is reasonable to initiate statin therapy for
patients >=55 years of age.

If CAC is >=100 or >=75th percentile, it is reasonable to initiate
statin therapy at any age.

Cardiology referral should be considered for patients with CAC
scores >=400 or >=75th percentile.

*8239 AHA/ACC/AACVPR/AAPA/ABC/LOAN/CEEJAY/PUTHENVETTILBABU/Ralph/JIM/RAJE/KCOMT
Guideline on the Management of Blood Cholesterol: A Report of the
American College of Cardiology/American Heart Association Task Force
on Clinical Practice Guidelines. J Am Coll Cardiol.
1818;73(24):1615-1896.

*** End of Addendum ***
EXAM:
OVER-READ INTERPRETATION  CT CHEST

The following report is an over-read performed by radiologist Dr.
Amnon Tiger [REDACTED] on 11/11/2020. This
over-read does not include interpretation of cardiac or coronary
anatomy or pathology. The coronary calcium score interpretation by
the cardiologist is attached.
FINDINGS: Moderate-sized hiatal hernia. Within the visualized portions of the
thorax there are no suspicious appearing pulmonary nodules or
masses, there is no acute consolidative airspace disease, no pleural
effusions, no pneumothorax and no lymphadenopathy. Visualized
portions of the upper abdomen are unremarkable. There are no
aggressive appearing lytic or blastic lesions noted in the
visualized portions of the skeleton.
IMPRESSION: 1. Moderate-sized hiatal hernia.

## 2022-02-17 ENCOUNTER — Other Ambulatory Visit: Payer: Self-pay | Admitting: Physician Assistant

## 2022-03-23 ENCOUNTER — Other Ambulatory Visit: Payer: Self-pay | Admitting: Sports Medicine

## 2022-03-23 ENCOUNTER — Ambulatory Visit
Admission: RE | Admit: 2022-03-23 | Discharge: 2022-03-23 | Disposition: A | Discharge status: Home / Self Care | Payer: Medicare Other | Source: Ambulatory Visit | Attending: Sports Medicine | Admitting: Sports Medicine

## 2022-03-23 DIAGNOSIS — R52 Pain, unspecified: Secondary | ICD-10-CM

## 2022-03-24 ENCOUNTER — Other Ambulatory Visit: Payer: Self-pay

## 2022-03-24 ENCOUNTER — Emergency Department (HOSPITAL_BASED_OUTPATIENT_CLINIC_OR_DEPARTMENT_OTHER)
Admission: EM | Admit: 2022-03-24 | Discharge: 2022-03-24 | Disposition: A | Discharge status: Home / Self Care | Payer: Medicare Other | Attending: Emergency Medicine | Admitting: Emergency Medicine

## 2022-03-24 ENCOUNTER — Emergency Department (HOSPITAL_BASED_OUTPATIENT_CLINIC_OR_DEPARTMENT_OTHER): Payer: Medicare Other

## 2022-03-24 DIAGNOSIS — M79671 Pain in right foot: Secondary | ICD-10-CM | POA: Insufficient documentation

## 2022-03-24 MED ORDER — ACETAMINOPHEN 500 MG PO TABS
1000.0000 mg | ORAL_TABLET | Freq: Once | ORAL | Status: AC
  Administered 2022-03-24: 1000 mg via ORAL
  Filled 2022-03-24: qty 2

## 2022-03-24 NOTE — ED Triage Notes (Signed)
Pt here the pickle ball court , pt jumped up and when she landed on her left foot she felt a pop , some swelling is noted to the arch of the foot

## 2022-03-24 NOTE — ED Notes (Signed)
Pt discharged home after verbalizing understanding of discharge instructions; nad noted. 

## 2022-03-24 NOTE — ED Notes (Signed)
Pt via pov from home with injury to right foot from playing pickleball. Swelling noted to right foot; pedal pulse intact; foot warm. Pt alert & oriented, nad noted.

## 2022-03-24 NOTE — ED Provider Notes (Signed)
[facility] EMERGENCY DEPT Provider Note   CSN: [id number] Arrival date & time: 03/24/22  1053     History No chief complaint on file.   HPI Deanna Peters is a 66 y.o. female presenting for right-sided foot pain.  Patient states that she has been more active than her baseline.  She has been participating in pickleball 3 times a week and has had multiple falls and episodes of pain with her increased physical activity.  She states that 2 days ago she was seen for a right wrist injury and this morning while she was playing she landed on her right heel hard and has ongoing right heel pain.  She states she has been struggling with ambulation secondary to the pain.  She denies fevers or chills nausea or vomiting, syncope or shortness of breath.  Patient otherwise ambulatory tolerating p.o. intake.  No known sick contacts.  Patient on multiple medications without acute change.   Patient's recorded medical, surgical, social, medication list and allergies were reviewed in the Snapshot window as part of the initial history.   Review of Systems   Review of Systems  Constitutional:  Negative for chills and fever.  HENT:  Negative for ear pain and sore throat.   Eyes:  Negative for pain and visual disturbance.  Respiratory:  Negative for cough and shortness of breath.   Cardiovascular:  Negative for chest pain and palpitations.  Gastrointestinal:  Negative for abdominal pain and vomiting.  Genitourinary:  Negative for dysuria and hematuria.  Musculoskeletal:  Negative for arthralgias and back pain.  Skin:  Negative for color change and rash.  Neurological:  Negative for seizures and syncope.  All other systems reviewed and are negative.   Physical Exam Updated Vital Signs BP 129/88   Pulse 80   Temp (!) 97 F (36.1 C) (Tympanic)   Resp 18   Ht 5\' 3"  (1.6 m)   Wt 72.6 kg   LMP 11/29/2005   SpO2 96%   BMI 28.34 kg/m  Physical Exam Vitals and nursing note reviewed.   Constitutional:      General: She is not in acute distress.    Appearance: She is well-developed.  HENT:     Head: Normocephalic and atraumatic.  Eyes:     Conjunctiva/sclera: Conjunctivae normal.  Cardiovascular:     Rate and Rhythm: Normal rate and regular rhythm.     Heart sounds: No murmur heard. Pulmonary:     Effort: Pulmonary effort is normal. No respiratory distress.     Breath sounds: Normal breath sounds.  Abdominal:     General: There is no distension.     Palpations: Abdomen is soft.     Tenderness: There is no abdominal tenderness. There is no right CVA tenderness or left CVA tenderness.  Musculoskeletal:        General: No swelling or tenderness. Normal range of motion.     Cervical back: Neck supple.     Comments: Full range of motion of the right ankle without pain along the Achilles tendon or over this individual metacarpals.  Some point tenderness right over patient's heel on the volar aspect.  Skin:    General: Skin is warm and dry.  Neurological:     General: No focal deficit present.     Mental Status: She is alert and oriented to person, place, and time. Mental status is at baseline.     Cranial Nerves: No cranial nerve deficit.      ED Course/  Medical Decision Making/ A&P    Procedures Procedures   Medications Ordered in ED Medications  acetaminophen (TYLENOL) tablet 1,000 mg (1,000 mg Oral Given 03/24/22 1205)    Medical Decision Making:    Deanna Peters is a 66 y.o. female who presented to the ED today with right foot pain detailed above.     Additional history discussed with patient's family/caregivers.  Patient's presentation is complicated by their history of multiple comorbid medical problems on outpatient medication regimens.  Patient placed on continuous vitals and telemetry monitoring while in ED which was reviewed periodically.   Complete initial physical exam performed, notably the patient  was hemodynamically stable in no acute  distress.  She is ambulatory tolerating p.o. intake.      Reviewed and confirmed nursing documentation for past medical history, family history, social history.    Initial Assessment:   Patient's history of present illness and physical exam findings are most consistent with acute foot injury.  X-rays were performed anddo not demonstrate an acute fracture.  Treated with anti-inflammatories and patient is able to ambulate with the support of a stabilization boot. Informed patient that she may have a subtle fracture not detected on initial x-rays and and that it is important that she follows up with an orthopedic if she continues to have ongoing foot pain as repeat x-rays in 5-7 days may be more diagnostic. Patient expressed understanding and patient discharged with no acute events. Clinical Impression:  1. Right foot pain      Discharge   Final Clinical Impression(s) / ED Diagnoses Final diagnoses:  Right foot pain    Rx / DC Orders ED Discharge Orders     None         Glyn Ade, MD 03/24/22 1224

## 2022-03-28 ENCOUNTER — Other Ambulatory Visit: Payer: Self-pay | Admitting: Sports Medicine

## 2022-03-28 DIAGNOSIS — M79641 Pain in right hand: Secondary | ICD-10-CM

## 2022-04-01 ENCOUNTER — Other Ambulatory Visit: Payer: Self-pay | Admitting: Physician Assistant

## 2022-04-09 ENCOUNTER — Ambulatory Visit
Admission: RE | Admit: 2022-04-09 | Discharge: 2022-04-09 | Disposition: A | Discharge status: Home / Self Care | Payer: Medicare Other | Source: Ambulatory Visit | Attending: Sports Medicine | Admitting: Sports Medicine

## 2022-04-09 DIAGNOSIS — M79641 Pain in right hand: Secondary | ICD-10-CM

## 2022-04-11 ENCOUNTER — Telehealth: Payer: Self-pay | Admitting: Physician Assistant

## 2022-04-11 ENCOUNTER — Other Ambulatory Visit: Payer: Self-pay | Admitting: Physician Assistant

## 2022-04-11 DIAGNOSIS — E782 Mixed hyperlipidemia: Secondary | ICD-10-CM

## 2022-04-11 NOTE — Telephone Encounter (Signed)
Pt is asking to have labs put in to check her cholesterol levels. She stated she did not need an office visit, just wants labs. Please advise

## 2022-04-12 ENCOUNTER — Encounter: Payer: Self-pay | Admitting: Physician Assistant

## 2022-04-13 ENCOUNTER — Encounter: Payer: Medicare Other | Admitting: Physician Assistant

## 2022-04-13 ENCOUNTER — Other Ambulatory Visit: Payer: Medicare Other

## 2022-04-25 ENCOUNTER — Encounter: Payer: Self-pay | Admitting: *Deleted

## 2022-05-04 ENCOUNTER — Other Ambulatory Visit (INDEPENDENT_AMBULATORY_CARE_PROVIDER_SITE_OTHER): Payer: Medicare Other

## 2022-05-04 DIAGNOSIS — Z0000 Encounter for general adult medical examination without abnormal findings: Secondary | ICD-10-CM

## 2022-05-04 DIAGNOSIS — E559 Vitamin D deficiency, unspecified: Secondary | ICD-10-CM

## 2022-05-04 DIAGNOSIS — E782 Mixed hyperlipidemia: Secondary | ICD-10-CM

## 2022-05-04 LAB — LIPID PANEL
Cholesterol: 146 mg/dL (ref 0–200)
HDL: 41.8 mg/dL (ref >39.00)
NonHDL: 104.06
Total CHOL/HDL Ratio: 3
Triglycerides: 220 mg/dL — ABNORMAL HIGH (ref 0.0–149.0)
VLDL: 44 mg/dL — ABNORMAL HIGH (ref 0.0–40.0)

## 2022-05-04 LAB — COMPREHENSIVE METABOLIC PANEL
ALT: 24 U/L (ref 0–35)
AST: 21 U/L (ref 0–37)
Albumin: 4.6 g/dL (ref 3.5–5.2)
Alkaline Phosphatase: 70 U/L (ref 39–117)
BUN: 22 mg/dL (ref 6–23)
CO2: 25 mEq/L (ref 19–32)
Calcium: 9.7 mg/dL (ref 8.4–10.5)
Chloride: 107 mEq/L (ref 96–112)
Creatinine, Ser: 0.84 mg/dL (ref 0.40–1.20)
GFR: 71.6 mL/min (ref >60.00)
Glucose, Bld: 100 mg/dL — ABNORMAL HIGH (ref 70–99)
Potassium: 4.2 mEq/L (ref 3.5–5.1)
Sodium: 142 mEq/L (ref 135–145)
Total Bilirubin: 0.7 mg/dL (ref 0.2–1.2)
Total Protein: 6.9 g/dL (ref 6.0–8.3)

## 2022-05-04 LAB — LDL CHOLESTEROL, DIRECT: Direct LDL: 70 mg/dL

## 2022-05-04 LAB — VITAMIN D 25 HYDROXY (VIT D DEFICIENCY, FRACTURES): VITD: 34.9 ng/mL (ref 30.00–100.00)

## 2022-05-04 NOTE — Addendum Note (Signed)
Addended by: Doran Clay A on: 05/04/2022 10:45 AM   Modules accepted: Orders

## 2022-05-11 ENCOUNTER — Encounter: Payer: Medicare Other | Admitting: Physician Assistant

## 2022-05-21 ENCOUNTER — Other Ambulatory Visit: Payer: Self-pay | Admitting: Physician Assistant

## 2022-05-25 ENCOUNTER — Ambulatory Visit (INDEPENDENT_AMBULATORY_CARE_PROVIDER_SITE_OTHER): Payer: Medicare Other | Admitting: Physician Assistant

## 2022-05-25 ENCOUNTER — Encounter: Payer: Self-pay | Admitting: Physician Assistant

## 2022-05-25 VITALS — BP 138/80 | HR 74 | Temp 98.0°F | Ht 63.0 in | Wt 162.2 lb

## 2022-05-25 DIAGNOSIS — G2581 Restless legs syndrome: Secondary | ICD-10-CM

## 2022-05-25 DIAGNOSIS — K219 Gastro-esophageal reflux disease without esophagitis: Secondary | ICD-10-CM

## 2022-05-25 DIAGNOSIS — F419 Anxiety disorder, unspecified: Secondary | ICD-10-CM

## 2022-05-25 DIAGNOSIS — E782 Mixed hyperlipidemia: Secondary | ICD-10-CM | POA: Diagnosis not present

## 2022-05-25 DIAGNOSIS — Z23 Encounter for immunization: Secondary | ICD-10-CM

## 2022-05-25 DIAGNOSIS — Z0000 Encounter for general adult medical examination without abnormal findings: Secondary | ICD-10-CM

## 2022-05-25 NOTE — Patient Instructions (Addendum)
It was great to see you!  For your restless leg syndrome: -Increase your requip to 0.5 mg nightly for about a week  -Message me if you like this or if you'd like to trial gabapentin instead  Call [facility] to see if you are due for your bone density scan  We can take over your wellbutrin and zoloft management.  Take care,  Aldona Bar

## 2022-05-25 NOTE — Progress Notes (Signed)
Subjective:    Deanna Peters is a 66 y.o. female and is here for a comprehensive physical exam.  HPI  Health Maintenance Due  Topic Date Due   Pneumonia Vaccine 85+ Years old (2 - PCV) 10/08/2021   INFLUENZA VACCINE  03/01/2022    Acute Concerns: None  Chronic Issues: HLD Reniyah is currently compliant with Crestor 10 mg daily. Improved lipid panel. She had calcium score by prior provider.   11/11/2020 IMPRESSION: Coronary calcium score of 14. This was 17 percentile for age-, race-, and sex-matched controls.   GERD Pt has been compliant with taking Protonix 40 mg daily with no adverse effects. She has found this to be very beneficial.   Restless Legs Occurs when she sits down in the evening and is severe at night. She has been having restless legs for years.  She is maintaining a well balanced diet and has not experienced any sx of low iron.  Anxiety Well controlled on Wellbutrin XL 300mg  daily and Zoloft 100mg  daily. No SI or HI.  She reports that her prescribing provider is retiring -- she is wanting to take over.  Health Maintenance: Immunizations -- Received flu and pneumonia vaccines today. Colonoscopy -- 11/06/2014, due in 2026 Mammogram -- 08/2021 PAP -- 09/29/2014 Bone Density -- 07/17/2017, T-score -0.9, due for DEXA Diet -- Well balanced, limited sweets Exercise -- Does some walking, plays pickleball and weightlifting.  Sleep habits -- Good Mood -- Okay  UTD with dentist? - Yes UTD with eye doctor? - Yes.   Denies any headaches, numbness, tingling, or focal weakness. Denies any leg swelling or GI symptoms.  Weight history: Wt Readings from Last 10 Encounters:  05/25/22 162 lb 4 oz (73.6 kg)  03/24/22 160 lb (72.6 kg)  07/13/21 157 lb (71.2 kg)  04/14/21 158 lb 9.6 oz (71.9 kg)  10/08/20 157 lb 12.8 oz (71.6 kg)  11/27/19 156 lb (70.8 kg)  08/08/18 157 lb 12.8 oz (71.6 kg)  04/17/18 162 lb 9.6 oz (73.8 kg)  12/04/17 163 lb 6.4 oz (74.1 kg)   10/23/17 163 lb (73.9 kg)   Body mass index is 28.74 kg/m. Patient's last menstrual period was 11/29/2005.  Alcohol use:  reports no history of alcohol use.  Tobacco use:  Tobacco Use: Low Risk  (05/25/2022)   Patient History    Smoking Tobacco Use: Never    Smokeless Tobacco Use: Never    Passive Exposure: Not on file   Eligible for lung cancer screening? no     05/25/2022    2:45 PM  Depression screen PHQ 2/9  Decreased Interest 0  Down, Depressed, Hopeless 0  PHQ - 2 Score 0     Other providers/specialists: Patient Care Team: 05/27/2022, 4/9 as PCP - General (Physician Assistant) Jarold Motto, MD as Consulting Physician (Obstetrics and Gynecology)    PMHx, SurgHx, SocialHx, Medications, and Allergies were reviewed in the Visit Navigator and updated as appropriate.   Past Medical History:  Diagnosis Date   Anxiety    Chicken pox    GERD (gastroesophageal reflux disease)    Hyperlipidemia    Panic disorder      Past Surgical History:  Procedure Laterality Date   APPENDECTOMY     CARPAL TUNNEL RELEASE Right 01/28/2014   Procedure: RIGHT CARPAL TUNNEL RELEASE;  Surgeon: Romualdo Bolk, MD;  Location: [facility];  Service: Orthopedics;  Laterality: Right;   LAPAROSCOPIC APPENDECTOMY N/A 10/30/2013   Procedure: APPENDECTOMY  LAPAROSCOPIC;  Surgeon: Mariella Saa, MD;  Location: [facility];  Service: General;  Laterality: N/A;   TONSILLECTOMY     WISDOM TOOTH EXTRACTION  age 62     Family History  Problem Relation Age of Onset   Hyperlipidemia Mother    Anxiety disorder Mother    Heart disease Father    GER disease Maternal Grandmother    Heart disease Paternal Grandfather    Cancer Neg Hx     Social History   Tobacco Use   Smoking status: Never   Smokeless tobacco: Never  Vaping Use   Vaping Use: Never used  Substance Use Topics   Alcohol use: Never   Drug use: Never    Review of Systems:   Review of  Systems  Constitutional:  Negative for chills, fever, malaise/fatigue and weight loss.  HENT:  Negative for hearing loss, sinus pain and sore throat.   Respiratory:  Negative for cough and hemoptysis.   Cardiovascular:  Negative for chest pain, palpitations, leg swelling and PND.  Gastrointestinal:  Negative for abdominal pain, constipation, diarrhea, heartburn, nausea and vomiting.  Genitourinary:  Negative for dysuria, frequency and urgency.  Musculoskeletal:  Negative for back pain, myalgias and neck pain.  Skin:  Negative for itching and rash.  Neurological:  Negative for dizziness, tingling, seizures and headaches.  Endo/Heme/Allergies:  Negative for polydipsia.  Psychiatric/Behavioral:  Negative for depression and suicidal ideas. The patient is not nervous/anxious.     Objective:   BP 138/80 (BP Location: Left Arm, Patient Position: Sitting, Cuff Size: Normal)   Pulse 74   Temp 98 F (36.7 C) (Temporal)   Ht 5\' 3"  (1.6 m)   Wt 162 lb 4 oz (73.6 kg)   LMP 11/29/2005   SpO2 95%   BMI 28.74 kg/m  Body mass index is 28.74 kg/m.   General Appearance:    Alert, cooperative, no distress, appears stated age  Head:    Normocephalic, without obvious abnormality, atraumatic  Eyes:    PERRL, conjunctiva/corneas clear, EOM's intact, fundi    benign, both eyes  Ears:    Normal TM's and external ear canals, both ears  Nose:   Nares normal, septum midline, mucosa normal, no drainage    or sinus tenderness  Throat:   Lips, mucosa, and tongue normal; teeth and gums normal  Neck:   Supple, symmetrical, trachea midline, no adenopathy;    thyroid:  no enlargement/tenderness/nodules; no carotid   bruit or JVD  Back:     Symmetric, no curvature, ROM normal, no CVA tenderness  Lungs:     Clear to auscultation bilaterally, respirations unlabored  Chest Wall:    No tenderness or deformity   Heart:    Regular rate and rhythm, S1 and S2 normal, no murmur, rub or gallop  Breast Exam:    Deferred   Abdomen:     Soft, non-tender, bowel sounds active all four quadrants,    no masses, no organomegaly  Genitalia:    Deferred  Extremities:   Extremities normal, atraumatic, no cyanosis or edema  Pulses:   2+ and symmetric all extremities  Skin:   Skin color, texture, turgor normal, no rashes or lesions  Lymph nodes:   Cervical, supraclavicular, and axillary nodes normal  Neurologic:   CNII-XII intact, normal strength, sensation and reflexes    throughout    Assessment/Plan:   Routine physical examination Today patient counseled on age appropriate routine health concerns for screening and prevention, each reviewed and up  to date or declined. Immunizations reviewed and up to date or declined. Labs ordered and reviewed. Risk factors for depression reviewed and negative. Hearing function and visual acuity are intact. ADLs screened and addressed as needed. Functional ability and level of safety reviewed and appropriate. Education, counseling and referrals performed based on assessed risks today. Patient provided with a copy of personalized plan for preventive services.  Mixed hyperlipidemia Well controlled Continue crestor 10 mg daily Follow-up in 1 yr  Gastroesophageal reflux disease without esophagitis Well controlled Continue protonix 40 mg daily Follow-up in 1 yr  Restless legs Uncontrolled Trial increase of requip to 0.5 mg daily If unsuccessful, will trial gabapentin  Anxiety Well controlled Continue zoloft 100 mg daily and wellbutrin 300 mg daily Follow-up in 1 year, sooner if concerns  Need for prophylactic vaccination against Streptococcus pneumoniae (pneumococcus) Updated today  I,Alexis Herring,acting as a scribe for [employer], PA.,have documented all relevant documentation on the behalf of Inda Coke, PA,as directed by  Inda Coke, PA while in the presence of Inda Coke, [place].  I, Inda Coke, [place], have reviewed all documentation for this visit.  The documentation on 05/25/22 for the exam, diagnosis, procedures, and orders are all accurate and complete.  Inda Coke PA-C

## 2022-05-26 ENCOUNTER — Encounter: Payer: Self-pay | Admitting: Physician Assistant

## 2022-05-30 ENCOUNTER — Encounter: Payer: Self-pay | Admitting: Physician Assistant

## 2022-05-30 NOTE — Telephone Encounter (Signed)
Please see message. I have sent request for mammogram.

## 2022-05-31 ENCOUNTER — Other Ambulatory Visit: Payer: Self-pay | Admitting: Physician Assistant

## 2022-05-31 MED ORDER — NYSTATIN 100000 UNIT/GM EX CREA: TOPICAL_CREAM | CUTANEOUS | 0 refills | Status: DC

## 2022-06-26 ENCOUNTER — Other Ambulatory Visit: Payer: Self-pay | Admitting: Physician Assistant

## 2022-07-12 ENCOUNTER — Other Ambulatory Visit: Payer: Self-pay | Admitting: Physician Assistant

## 2022-07-12 MED ORDER — ROPINIROLE HCL 0.5 MG PO TABS: 0.5000 mg | ORAL_TABLET | Freq: Every day | ORAL | 1 refills | Status: DC

## 2022-09-07 LAB — HM DEXA SCAN

## 2022-09-07 LAB — HM MAMMOGRAPHY

## 2022-09-12 ENCOUNTER — Encounter: Payer: Self-pay | Admitting: Physician Assistant

## 2022-09-12 ENCOUNTER — Ambulatory Visit (INDEPENDENT_AMBULATORY_CARE_PROVIDER_SITE_OTHER): Payer: Medicare Other | Admitting: Physician Assistant

## 2022-09-12 VITALS — BP 116/70 | HR 75 | Temp 97.5°F | Ht 63.0 in | Wt 163.0 lb

## 2022-09-12 DIAGNOSIS — R300 Dysuria: Secondary | ICD-10-CM

## 2022-09-12 DIAGNOSIS — G2581 Restless legs syndrome: Secondary | ICD-10-CM | POA: Diagnosis not present

## 2022-09-12 LAB — POCT URINALYSIS DIPSTICK
Bilirubin, UA: NEGATIVE
Blood, UA: NEGATIVE
Glucose, UA: NEGATIVE
Ketones, UA: NEGATIVE
Leukocytes, UA: NEGATIVE
Nitrite, UA: NEGATIVE
Protein, UA: NEGATIVE
Spec Grav, UA: 1.01 (ref 1.010–1.025)
Urobilinogen, UA: 0.2 E.U./dL
pH, UA: 5.5 (ref 5.0–8.0)

## 2022-09-12 MED ORDER — CEPHALEXIN 500 MG PO CAPS: 500.0000 mg | ORAL_CAPSULE | Freq: Two times a day (BID) | ORAL | 0 refills | Status: DC

## 2022-09-12 NOTE — Patient Instructions (Addendum)
It was great to see you!  Start Keflex 500 mg twice daily -- I will be in touch with your urine culture results  Increase your requip to 1 mg nightly (2 x 0.5 mg tablets) -- report back in 1-2 weeks if this is helpful for your restless legs  General instructions Make sure you: Pee until your bladder is empty. Do not hold pee for a long time. Empty your bladder after sex. Wipe from front to back after pooping if you are a female. Use each tissue one time when you wipe. Drink enough fluid to keep your pee pale yellow. Keep all follow-up visits as told by your doctor. This is important. Contact a doctor if: You do not get better after 1-2 days. Your symptoms go away and then come back. Get help right away if: You have very bad back pain. You have very bad pain in your lower belly. You have a fever. You are sick to your stomach (nauseous). You are throwing up.   Take care,  Inda Coke PA-C

## 2022-09-12 NOTE — Progress Notes (Signed)
Deanna Peters is a 67 y.o. female here for a new problem.  History of Present Illness:   Chief Complaint  Patient presents with   Dysuria    Pt c/o frequency and burning with urination started yesterday. Denies back pain , no fever or chills.    Dysuria     UTI sx Sx started yesterday Recurrent hx  Taking cranberry juice to tx on own, no AZO Denies recent travel or known causes Doesn't feel like she drinks enough water throughout the day Denies: fever, chills, vaginal discharge, n/v/d, malaise, unusual back pain  RLS Currently taking requip 0.5 mg daily Does not feel like its helping  Past Medical History:  Diagnosis Date   Anxiety    Chicken pox    GERD (gastroesophageal reflux disease)    Hyperlipidemia    Panic disorder      Social History   Tobacco Use   Smoking status: Never   Smokeless tobacco: Never  Vaping Use   Vaping Use: Never used  Substance Use Topics   Alcohol use: Never   Drug use: Never    Past Surgical History:  Procedure Laterality Date   APPENDECTOMY     CARPAL TUNNEL RELEASE Right 01/28/2014   Procedure: RIGHT CARPAL TUNNEL RELEASE;  Surgeon: Cammie Sickle, MD;  Location: [facility];  Service: Orthopedics;  Laterality: Right;   LAPAROSCOPIC APPENDECTOMY N/A 10/30/2013   Procedure: APPENDECTOMY LAPAROSCOPIC;  Surgeon: Edward Jolly, MD;  Location: [facility];  Service: General;  Laterality: N/A;   TONSILLECTOMY     WISDOM TOOTH EXTRACTION  age 59    Family History  Problem Relation Age of Onset   Hyperlipidemia Mother    Anxiety disorder Mother    Heart disease Father    GER disease Maternal Grandmother    Heart disease Paternal Grandfather    Cancer Neg Hx     Allergies  Allergen Reactions   Nickel Itching and Swelling   Sulfa Antibiotics Itching and Rash    Current Medications:   Current Outpatient Medications:    ALPRAZolam (XANAX) 0.25 MG tablet, Take 0.125-0.25 mg by mouth 2 (two) times  daily., Disp: , Rfl:    buPROPion (WELLBUTRIN XL) 300 MG 24 hr tablet, Take 300 mg by mouth daily., Disp: , Rfl:    meloxicam (MOBIC) 15 MG tablet, Take 15 mg by mouth daily., Disp: , Rfl:    nystatin cream (MYCOSTATIN), Apply to affected area 1-2 times daily, Disp: 30 g, Rfl: 0   pantoprazole (PROTONIX) 40 MG tablet, TAKE 1 TABLET BY MOUTH EVERY DAY, Disp: 90 tablet, Rfl: 0   PRESCRIPTION MEDICATION, 1 application . Triple Rosacea Cream Azelaic acid15%/Metronidazole 1%/Ivermectin 1% prescribed by Dermatology, Disp: , Rfl:    rOPINIRole (REQUIP) 0.5 MG tablet, Take 1 tablet (0.5 mg total) by mouth at bedtime., Disp: 90 tablet, Rfl: 1   rosuvastatin (CRESTOR) 10 MG tablet, TAKE 1 TABLET BY MOUTH EVERY DAY, Disp: 90 tablet, Rfl: 1   sertraline (ZOLOFT) 100 MG tablet, Take 100 mg by mouth daily., Disp: , Rfl:    Review of Systems:   Review of Systems  Genitourinary:  Positive for dysuria.   Negative unless otherwise specified per HPI.  Vitals:   Vitals:   09/12/22 1438  BP: 116/70  Pulse: 75  Temp: (!) 97.5 F (36.4 C)  TempSrc: Temporal  SpO2: 97%  Weight: 163 lb (73.9 kg)  Height: 5' 3"$  (1.6 m)     Body mass index  is 28.87 kg/m.  Physical Exam:   Physical Exam Vitals and nursing note reviewed.  Constitutional:      General: She is not in acute distress.    Appearance: She is well-developed. She is not ill-appearing or toxic-appearing.  Cardiovascular:     Rate and Rhythm: Normal rate and regular rhythm.     Pulses: Normal pulses.     Heart sounds: Normal heart sounds, S1 normal and S2 normal.  Pulmonary:     Effort: Pulmonary effort is normal.     Breath sounds: Normal breath sounds.  Abdominal:     Tenderness: There is no right CVA tenderness or left CVA tenderness.  Skin:    General: Skin is warm and dry.  Neurological:     Mental Status: She is alert.     GCS: GCS eye subscore is 4. GCS verbal subscore is 5. GCS motor subscore is 6.  Psychiatric:         Speech: Speech normal.        Behavior: Behavior normal. Behavior is cooperative.    Results for orders placed or performed in visit on 09/12/22  POCT urinalysis dipstick  Result Value Ref Range   Color, UA yellow    Clarity, UA clear    Glucose, UA Negative Negative   Bilirubin, UA Negative    Ketones, UA Negative    Spec Grav, UA 1.010 1.010 - 1.025   Blood, UA Negative    pH, UA 5.5 5.0 - 8.0   Protein, UA Negative Negative   Urobilinogen, UA 0.2 0.2 or 1.0 E.U./dL   Nitrite, UA Negative    Leukocytes, UA Negative Negative   Appearance     Odor      Assessment and Plan:   Dysuria UA neg; no red flags Suspect acute cystitis Empirically treat with keflex 500 mg BID x 7 days Urine culture ordered Push fluids and rest Worsening precautions discussed  Restless legs Uncontrolled Increase requip to 1 mg nightly Send mychart message in 1-2 weeks with effect, sooner if concerns   Inda Coke, PA-C

## 2022-09-13 LAB — URINE CULTURE
MICRO NUMBER:: 14552158
SPECIMEN QUALITY:: ADEQUATE

## 2022-09-22 ENCOUNTER — Other Ambulatory Visit: Payer: Self-pay | Admitting: Physician Assistant

## 2022-10-05 ENCOUNTER — Encounter: Payer: Self-pay | Admitting: Physician Assistant

## 2022-10-06 ENCOUNTER — Other Ambulatory Visit: Payer: Self-pay | Admitting: Physician Assistant

## 2022-10-06 MED ORDER — ROPINIROLE HCL 2 MG PO TABS
2.0000 mg | ORAL_TABLET | Freq: Every day | ORAL | 1 refills | Status: DC
Start: 2022-10-06 — End: 2022-10-31

## 2022-10-30 ENCOUNTER — Other Ambulatory Visit: Payer: Self-pay | Admitting: Physician Assistant

## 2022-12-21 ENCOUNTER — Other Ambulatory Visit: Payer: Self-pay | Admitting: Physician Assistant

## 2023-01-16 ENCOUNTER — Other Ambulatory Visit: Payer: Self-pay | Admitting: Physician Assistant

## 2023-03-23 ENCOUNTER — Other Ambulatory Visit: Payer: Self-pay | Admitting: Physician Assistant

## 2023-06-18 ENCOUNTER — Other Ambulatory Visit: Payer: Self-pay | Admitting: Physician Assistant

## 2023-06-27 NOTE — Progress Notes (Signed)
Deanna Peters is a 67 y.o. female {ELNP/FU:31256} History of Present Illness:   Health Maintenance Due  Topic Date Due   Zoster Vaccines- Shingrix (1 of 2) 04/21/1973   Medicare Annual Wellness (AWV)  12/17/2022   INFLUENZA VACCINE  03/02/2023   No chief complaint on file.  HPI ***  ***  ***  ***  ***  *** Past Medical History:  Diagnosis Date   Anxiety    Chicken pox    GERD (gastroesophageal reflux disease)    Hyperlipidemia    Panic disorder     Social History   Tobacco Use   Smoking status: Never   Smokeless tobacco: Never  Vaping Use   Vaping status: Never Used  Substance Use Topics   Alcohol use: Never   Drug use: Never   Past Surgical History:  Procedure Laterality Date   APPENDECTOMY     CARPAL TUNNEL RELEASE Right 01/28/2014   Procedure: RIGHT CARPAL TUNNEL RELEASE;  Surgeon: Wyn Forster, MD;  Location: [facility];  Service: Orthopedics;  Laterality: Right;   LAPAROSCOPIC APPENDECTOMY N/A 10/30/2013   Procedure: APPENDECTOMY LAPAROSCOPIC;  Surgeon: Mariella Saa, MD;  Location: [facility];  Service: General;  Laterality: N/A;   TONSILLECTOMY     WISDOM TOOTH EXTRACTION  age 21   Family History  Problem Relation Age of Onset   Hyperlipidemia Mother    Anxiety disorder Mother    Heart disease Father    GER disease Maternal Grandmother    Heart disease Paternal Grandfather    Cancer Neg Hx    Allergies  Allergen Reactions   Nickel Itching and Swelling   Sulfa Antibiotics Itching and Rash   Current Medications:   Current Outpatient Medications:    ALPRAZolam (XANAX) 0.25 MG tablet, Take 0.125-0.25 mg by mouth 2 (two) times daily., Disp: , Rfl:    buPROPion (WELLBUTRIN XL) 300 MG 24 hr tablet, Take 300 mg by mouth daily., Disp: , Rfl:    cephALEXin (KEFLEX) 500 MG capsule, Take 1 capsule (500 mg total) by mouth 2 (two) times daily., Disp: 14 capsule, Rfl: 0   meloxicam (MOBIC) 15 MG tablet, Take 15 mg by mouth  daily., Disp: , Rfl:    nystatin cream (MYCOSTATIN), Apply to affected area 1-2 times daily, Disp: 30 g, Rfl: 0   pantoprazole (PROTONIX) 40 MG tablet, TAKE 1 TABLET BY MOUTH EVERY DAY, Disp: 90 tablet, Rfl: 1   PRESCRIPTION MEDICATION, 1 application . Triple Rosacea Cream Azelaic acid15%/Metronidazole 1%/Ivermectin 1% prescribed by Dermatology, Disp: , Rfl:    rOPINIRole (REQUIP) 2 MG tablet, TAKE 1 TABLET BY MOUTH AT BEDTIME., Disp: 90 tablet, Rfl: 1   rosuvastatin (CRESTOR) 10 MG tablet, TAKE 1 TABLET BY MOUTH EVERY DAY, Disp: 90 tablet, Rfl: 1   sertraline (ZOLOFT) 100 MG tablet, Take 100 mg by mouth daily., Disp: , Rfl:   Review of Systems:   ROS See pertinent positives and negatives as per the HPI.  Vitals:   There were no vitals filed for this visit.   There is no height or weight on file to calculate BMI.  Physical Exam:   Physical Exam  Assessment and Plan:   There are no diagnoses linked to this encounter.            I,Emily Lagle,acting as a Neurosurgeon for [employer], PA.,have documented all relevant documentation on the behalf of Jarold Motto, PA,as directed by  Jarold Motto, PA while in the presence of [address]  [place] Butt[place], [place].  *** (refresh reminder)  I, Jarold Motto, PA, have reviewed all documentation for this visit. The documentation on 06/27/23 for the exam, diagnosis, procedures, and orders are all accurate and complete.  Jarold Motto, PA-C

## 2023-06-28 ENCOUNTER — Encounter: Payer: Self-pay | Admitting: Physician Assistant

## 2023-06-28 ENCOUNTER — Ambulatory Visit (INDEPENDENT_AMBULATORY_CARE_PROVIDER_SITE_OTHER): Payer: Medicare Other | Admitting: Physician Assistant

## 2023-06-28 VITALS — BP 130/80 | HR 73 | Temp 97.5°F | Ht 63.0 in | Wt 158.2 lb

## 2023-06-28 DIAGNOSIS — R051 Acute cough: Secondary | ICD-10-CM

## 2023-06-28 DIAGNOSIS — Z23 Encounter for immunization: Secondary | ICD-10-CM | POA: Diagnosis not present

## 2023-06-28 NOTE — Addendum Note (Signed)
Addended by: Jimmye Norman on: 06/28/2023 11:15 AM   Modules accepted: Orders

## 2023-07-04 ENCOUNTER — Other Ambulatory Visit: Payer: Self-pay | Admitting: Sports Medicine

## 2023-07-04 DIAGNOSIS — R29898 Other symptoms and signs involving the musculoskeletal system: Secondary | ICD-10-CM

## 2023-07-04 DIAGNOSIS — G2581 Restless legs syndrome: Secondary | ICD-10-CM

## 2023-08-08 ENCOUNTER — Ambulatory Visit
Admission: RE | Admit: 2023-08-08 | Discharge: 2023-08-08 | Disposition: A | Discharge status: Home / Self Care | Payer: Medicare Other | Source: Ambulatory Visit | Attending: Sports Medicine | Admitting: Sports Medicine

## 2023-08-08 DIAGNOSIS — G2581 Restless legs syndrome: Secondary | ICD-10-CM

## 2023-08-08 DIAGNOSIS — R29898 Other symptoms and signs involving the musculoskeletal system: Secondary | ICD-10-CM

## 2023-09-11 ENCOUNTER — Encounter: Payer: Self-pay | Admitting: Physician Assistant

## 2023-09-12 ENCOUNTER — Encounter: Payer: Self-pay | Admitting: Physician Assistant

## 2023-09-13 ENCOUNTER — Encounter: Payer: Self-pay | Admitting: Physician Assistant

## 2023-09-13 LAB — HM MAMMOGRAPHY

## 2023-09-14 ENCOUNTER — Other Ambulatory Visit: Payer: Self-pay | Admitting: Physician Assistant

## 2023-12-14 ENCOUNTER — Encounter: Payer: Self-pay | Admitting: Physician Assistant

## 2023-12-19 ENCOUNTER — Other Ambulatory Visit

## 2023-12-21 ENCOUNTER — Ambulatory Visit (INDEPENDENT_AMBULATORY_CARE_PROVIDER_SITE_OTHER)

## 2023-12-21 VITALS — Ht 63.5 in | Wt 158.0 lb

## 2023-12-21 DIAGNOSIS — Z0000 Encounter for general adult medical examination without abnormal findings: Secondary | ICD-10-CM

## 2023-12-21 NOTE — Patient Instructions (Addendum)
 Ms. Deanna Peters , Thank you for taking time out of your busy schedule to complete your Annual Wellness Visit with me. I enjoyed our conversation and look forward to speaking with you again next year. I, as well as your care team,  appreciate your ongoing commitment to your health goals. Please review the following plan we discussed and let me know if I can assist you in the future. Your Game plan/ To Do List    Referrals: If you haven't heard from the office you've been referred to, please reach out to them at the phone provided.   Follow up Visits: Next Medicare AWV with our clinical staff: 01/02/25   Have you seen your provider in the last 6 months (3 months if uncontrolled diabetes)? Yes Next Office Visit 12/28/23 @ 11:00 a.m   Clinician Recommendations:  Aim for 30 minutes of exercise or brisk walking, 6-8 glasses of water, and 5 servings of fruits and vegetables each day.        This is a list of the screening recommended for you and due dates:  Health Maintenance  Topic Date Due   DTaP/Tdap/Td vaccine (2 - Td or Tdap) 09/21/2023   Zoster (Shingles) Vaccine (1 of 2) 06/27/2024*   Flu Shot  03/01/2024   Colon Cancer Screening  11/05/2024   Medicare Annual Wellness Visit  12/20/2024   Mammogram  09/12/2025   DEXA scan (bone density measurement)  09/08/2027   Pneumonia Vaccine  Completed   Hepatitis C Screening  Completed   HPV Vaccine  Aged Out   Meningitis B Vaccine  Aged Out   COVID-19 Vaccine  Discontinued  *Topic was postponed. The date shown is not the original due date.    Advanced directives: (Copy Requested) Please bring a copy of your health care power of attorney and living will to the office to be added to your chart at your convenience. You can mail to [facility] [address] [place], [place] [address] or email to [email or web address] Advance Care Planning is important because it:  [x]  Makes sure you receive the medical care that is consistent  with your values, goals, and preferences  [x]  It provides guidance to your family and loved ones and reduces their decisional burden about whether or not they are making the right decisions based on your wishes.  Follow the link provided in your after visit summary or read over the paperwork we have mailed to you to help you started getting your Advance Directives in place. If you need assistance in completing these, please reach out to us  so that we can help you!  See attachments for Preventive Care and Fall Prevention Tips.

## 2023-12-21 NOTE — Progress Notes (Signed)
 Subjective:   Deanna Peters is a 68 y.o. who presents for a Medicare Wellness preventive visit.  As a reminder, Annual Wellness Visits don't include a physical exam, and some assessments may be limited, especially if this visit is performed virtually. We may recommend an in-person follow-up visit with your provider if needed.  Visit Complete: Virtual I connected with  BINTOU LAFATA on 12/21/23 by a audio enabled telemedicine application and verified that I am speaking with the correct person using two identifiers.  Patient Location: Home  Provider Location: Office/Clinic  I discussed the limitations of evaluation and management by telemedicine. The patient expressed understanding and agreed to proceed.  Vital Signs: Because this visit was a virtual/telehealth visit, some criteria may be missing or patient reported. Any vitals not documented were not able to be obtained and vitals that have been documented are patient reported.  VideoDeclined- This patient declined Librarian, academic. Therefore the visit was completed with audio only.  Persons Participating in Visit: Patient.  AWV Questionnaire: No: Patient Medicare AWV questionnaire was not completed prior to this visit.  Cardiac Risk Factors include: advanced age (>75men, >4 women);dyslipidemia     Objective:     Today's Vitals   12/21/23 1440  Weight: 158 lb (71.7 kg)  Height: 5' 3.5" (1.613 m)   Body mass index is 27.55 kg/m.     12/21/2023    2:45 PM 12/16/2021   11:01 AM 01/22/2014   12:51 PM 10/31/2013    2:00 AM  Advanced Directives  Does Patient Have a Medical Advance Directive? Yes Yes Patient has advance directive, copy not in chart Patient does not have advance directive  Type of Advance Directive Healthcare Power of [place];Living will Healthcare Power of [place];Living will    Copy of Healthcare Power of Attorney in Chart? No - copy requested No - copy requested     Pre-existing out of facility DNR order (yellow form or pink MOST form)    No    Current Medications (verified) Outpatient Encounter Medications as of 12/21/2023  Medication Sig   buPROPion  (WELLBUTRIN  XL) 300 MG 24 hr tablet Take 300 mg by mouth daily.   pantoprazole  (PROTONIX ) 40 MG tablet TAKE 1 TABLET BY MOUTH EVERY DAY   PRESCRIPTION MEDICATION 1 application . Triple Rosacea Cream Azelaic acid15%/Metronidazole 1%/Ivermectin 1% prescribed by Dermatology   sertraline  (ZOLOFT ) 100 MG tablet Take 100 mg by mouth daily.   ALPRAZolam (XANAX) 0.25 MG tablet Take 0.125-0.25 mg by mouth as needed. (Patient not taking: Reported on 12/21/2023)   [DISCONTINUED] rOPINIRole  (REQUIP ) 2 MG tablet TAKE 1 TABLET BY MOUTH AT BEDTIME.   [DISCONTINUED] rosuvastatin  (CRESTOR ) 10 MG tablet TAKE 1 TABLET BY MOUTH EVERY DAY   No facility-administered encounter medications on file as of 12/21/2023.    Allergies (verified) Nickel and Sulfa antibiotics   History: Past Medical History:  Diagnosis Date   Anxiety    Chicken pox    GERD (gastroesophageal reflux disease)    Hyperlipidemia    Panic disorder    Past Surgical History:  Procedure Laterality Date   APPENDECTOMY     CARPAL TUNNEL RELEASE Right 01/28/2014   Procedure: RIGHT CARPAL TUNNEL RELEASE;  Surgeon: Amelie Baize, MD;  Location: [facility];  Service: Orthopedics;  Laterality: Right;   LAPAROSCOPIC APPENDECTOMY N/A 10/30/2013   Procedure: APPENDECTOMY LAPAROSCOPIC;  Surgeon: Quitman Bucy, MD;  Location: [facility];  Service: General;  Laterality: N/A;   TONSILLECTOMY  WISDOM TOOTH EXTRACTION  age 18   Family History  Problem Relation Age of Onset   Hyperlipidemia Mother    Anxiety disorder Mother    Heart disease Father    GER disease Maternal Grandmother    Heart disease Paternal Grandfather    Cancer Neg Hx    Social History   Socioeconomic History   Marital status: Married    Spouse name: Not on file    Number of children: 2   Years of education: Not on file   Highest education level: Not on file  Occupational History   Occupation: homemaker  Tobacco Use   Smoking status: Never   Smokeless tobacco: Never  Vaping Use   Vaping status: Never Used  Substance and Sexual Activity   Alcohol use: Never   Drug use: Never   Sexual activity: Yes    Partners: Male    Birth control/protection: Post-menopausal  Other Topics Concern   Not on file  Social History Narrative   Not on file   Social Drivers of Health   Financial Resource Strain: Low Risk  (12/21/2023)   Overall Financial Resource Strain (CARDIA)    Difficulty of Paying Living Expenses: Not hard at all  Food Insecurity: No Food Insecurity (12/21/2023)   Hunger Vital Sign    Worried About Running Out of Food in the Last Year: Never true    Ran Out of Food in the Last Year: Never true  Transportation Needs: No Transportation Needs (12/21/2023)   PRAPARE - Administrator, Civil Service (Medical): No    Lack of Transportation (Non-Medical): No  Physical Activity: Sufficiently Active (12/21/2023)   Exercise Vital Sign    Days of Exercise per Week: 4 days    Minutes of Exercise per Session: 150+ min  Stress: No Stress Concern Present (12/21/2023)   [employer] of Occupational Health - Occupational Stress Questionnaire    Feeling of Stress : Not at all  Social Connections: Moderately Integrated (12/21/2023)   Social Connection and Isolation Panel [NHANES]    Frequency of Communication with Friends and Family: More than three times a week    Frequency of Social Gatherings with Friends and Family: More than three times a week    Attends Religious Services: Never    Database administrator or Organizations: Yes    Attends Engineer, structural: 1 to 4 times per year    Marital Status: Married    Tobacco Counseling Counseling given: Not Answered    Clinical Intake:  Pre-visit preparation completed:  Yes  Pain : No/denies pain     BMI - recorded: 27.55 Nutritional Status: BMI 25 -29 Overweight Nutritional Risks: None Diabetes: No  Lab Results  Component Value Date   HGBA1C 5.2 10/24/2016   HGBA1C 5.5 10/14/2015     How often do you need to have someone help you when you read instructions, pamphlets, or other written materials from your doctor or pharmacy?: 1 - Never  Interpreter Needed?: No  Information entered by :: Lamont Pilsner, LPN   Activities of Daily Living     12/21/2023    2:42 PM  In your present state of health, do you have any difficulty performing the following activities:  Hearing? 0  Vision? 0  Difficulty concentrating or making decisions? 0  Walking or climbing stairs? 0  Dressing or bathing? 0  Doing errands, shopping? 0  Preparing Food and eating ? N  Using the Toilet? N  In the  past six months, have you accidently leaked urine? N  Do you have problems with loss of bowel control? N  Managing your Medications? N  Managing your Finances? N  Housekeeping or managing your Housekeeping? N    Patient Care Team: Alexander Iba, [place] as PCP - General (Physician Assistant) Wanita Gutta, MD as Consulting Physician (Obstetrics and Gynecology)  Indicate any recent Medical Services you may have received from other than [facility] providers in the past year (date may be approximate).     Assessment:    This is a routine wellness examination for Janaa.  Hearing/Vision screen Hearing Screening - Comments:: Pt denies any hearing issues  Vision Screening - Comments:: Wears rx glasses - up to date with routine eye exams with [facility]     Goals Addressed             This Visit's Progress    Patient Stated       Maintain health and activity        Depression Screen     12/21/2023    2:44 PM 06/28/2023   10:28 AM 05/25/2022    2:45 PM 12/16/2021   11:01 AM 04/14/2021    2:33 PM  PHQ 2/9 Scores  PHQ - 2 Score 0 0 0 0 0    Fall  Risk     12/21/2023    2:45 PM 06/28/2023   10:28 AM 12/16/2021   11:02 AM 04/14/2021    2:33 PM  Fall Risk   Falls in the past year? 0 1 0 0  Number falls in past yr: 0 0 0 0  Injury with Fall? 0 0 0 0  Risk for fall due to : No Fall Risks Impaired balance/gait Impaired vision No Fall Risks  Follow up Falls prevention discussed Falls evaluation completed Falls prevention discussed     MEDICARE RISK AT HOME:  Medicare Risk at Home Any stairs in or around the home?: Yes If so, are there any without handrails?: No Home free of loose throw rugs in walkways, pet beds, electrical cords, etc?: Yes Adequate lighting in your home to reduce risk of falls?: Yes Life alert?: No Use of a cane, walker or w/c?: No Grab bars in the bathroom?: No Shower chair or bench in shower?: Yes Elevated toilet seat or a handicapped toilet?: No  TIMED UP AND GO:  Was the test performed?  No  Cognitive Function: 6CIT completed        12/21/2023    2:46 PM 12/16/2021   11:04 AM  6CIT Screen  What Year? 0 points 0 points  What month? 0 points 0 points  What time? 0 points 0 points  Count back from 20 0 points 0 points  Months in reverse 0 points 0 points  Repeat phrase 0 points 0 points  Total Score 0 points 0 points    Immunizations Immunization History  Administered Date(s) Administered   Fluad Quad(high Dose 65+) 07/08/2021, 05/25/2022   Fluad Trivalent(High Dose 65+) 06/28/2023   Influenza Inj Mdck Quad Pf 07/01/2016   Influenza Inj Mdck Quad With Preservative 07/09/2018   Influenza-Unspecified 05/02/2015   Moderna Covid-19 Vaccine Bivalent Booster 72yrs & up 07/29/2019, 08/30/2019   Moderna SARS-COV2 Booster Vaccination 06/20/2020   PNEUMOCOCCAL CONJUGATE-20 05/25/2022   Pneumococcal Polysaccharide-23 10/08/2020   Respiratory Syncytial Virus Vaccine,Recomb Aduvanted(Arexvy) 10/22/2022   Tdap 09/20/2013   Zoster, Live 08/01/2014    Screening Tests Health Maintenance  Topic Date Due    DTaP/Tdap/Td (2 -  Td or Tdap) 09/21/2023   Zoster Vaccines- Shingrix (1 of 2) 06/27/2024 (Originally [birth year]21/1973)   INFLUENZA VACCINE  03/01/2024   Colonoscopy  11/05/2024   Medicare Annual Wellness (AWV)  12/20/2024   MAMMOGRAM  09/12/2025   DEXA SCAN  09/08/2027   Pneumonia Vaccine 27+ Years old  Completed   Hepatitis C Screening  Completed   HPV VACCINES  Aged Out   Meningococcal B Vaccine  Aged Out   COVID-19 Vaccine  Discontinued    Health Maintenance  Health Maintenance Due  Topic Date Due   DTaP/Tdap/Td (2 - Td or Tdap) 09/21/2023   Health Maintenance Items Addressed: See Nurse Notes  Additional Screening:  Vision Screening: Recommended annual ophthalmology exams for early detection of glaucoma and other disorders of the eye.  Dental Screening: Recommended annual dental exams for proper oral hygiene  Community Resource Referral / Chronic Care Management: CRR required this visit?  No   CCM required this visit?  No   Plan:    I have personally reviewed and noted the following in the patient's chart:   Medical and social history Use of alcohol, tobacco or illicit drugs  Current medications and supplements including opioid prescriptions. Patient is not currently taking opioid prescriptions. Functional ability and status Nutritional status Physical activity Advanced directives List of other physicians Hospitalizations, surgeries, and ER visits in previous 12 months Vitals Screenings to include cognitive, depression, and falls Referrals and appointments  In addition, I have reviewed and discussed with patient certain preventive protocols, quality metrics, and best practice recommendations. A written personalized care plan for preventive services as well as general preventive health recommendations were provided to patient.   Bruno Capri, LPN   0/[id number]/5784   After Visit Summary: (MyChart) Due to this being a telephonic visit, the after visit summary with  patients personalized plan was offered to patient via MyChart   Notes: Nothing significant to report at this time.

## 2023-12-28 ENCOUNTER — Ambulatory Visit: Admitting: Physician Assistant

## 2023-12-29 ENCOUNTER — Encounter: Payer: Self-pay | Admitting: Physician Assistant

## 2023-12-29 ENCOUNTER — Ambulatory Visit (INDEPENDENT_AMBULATORY_CARE_PROVIDER_SITE_OTHER): Admitting: Physician Assistant

## 2023-12-29 VITALS — BP 124/86 | HR 74 | Temp 97.7°F | Ht 63.5 in | Wt 155.5 lb

## 2023-12-29 DIAGNOSIS — F419 Anxiety disorder, unspecified: Secondary | ICD-10-CM | POA: Diagnosis not present

## 2023-12-29 DIAGNOSIS — E782 Mixed hyperlipidemia: Secondary | ICD-10-CM | POA: Diagnosis not present

## 2023-12-29 DIAGNOSIS — F32A Depression, unspecified: Secondary | ICD-10-CM | POA: Diagnosis not present

## 2023-12-29 DIAGNOSIS — E663 Overweight: Secondary | ICD-10-CM

## 2023-12-29 MED ORDER — PRAVASTATIN SODIUM 10 MG PO TABS: 10.0000 mg | ORAL_TABLET | Freq: Every day | ORAL | 3 refills | Status: AC

## 2023-12-29 MED ORDER — SERTRALINE HCL 100 MG PO TABS: 100.0000 mg | ORAL_TABLET | Freq: Every day | ORAL | 3 refills | Status: AC

## 2023-12-29 MED ORDER — TIRZEPATIDE-WEIGHT MANAGEMENT 2.5 MG/0.5ML ~~LOC~~ SOLN: 2.5000 mg | SUBCUTANEOUS | 2 refills | Status: DC

## 2023-12-29 MED ORDER — BUPROPION HCL ER (XL) 300 MG PO TB24: 300.0000 mg | ORAL_TABLET | Freq: Every day | ORAL | 3 refills | Status: DC

## 2023-12-29 NOTE — Patient Instructions (Signed)
 It was great to see you!  Please make an appointment with the lab on your way out. I would like for you to return for lab work within 2-4 weeks. After midnight on the day of the lab draw, please do not eat anything. You may have water, black coffee, unsweetened tea.  I have sent in 2.5 mg Zepbound vials to [facility]  Let's follow-up in 3 months after starting Zepbound, sooner if you have concerns.  Take care,  Alexander Iba PA-C

## 2023-12-29 NOTE — Progress Notes (Signed)
 Deanna Peters is a 68 y.o. female here for a follow up of a pre-existing problem.  History of Present Illness:   Chief Complaint  Patient presents with   Medical Management of Chronic Issues    Anxiety/Depression, Hyperlipidemia Pt stopped statin 2 months ago due body aches all over.   Anxiety / Depression: Pt is on Wellbutrin  XL 300 mg daily, Zoloft  100 mg daily, Xanax 0.25 mg as needed. Has been on her current regimen for several years; stable with good tolerance and no side effects.  Takes her Xanax only a couple of times a year when she has difficulty sleeping.  She is requesting a refill of Wellbutrin  and Zoloft , stating her former prescribing provider is retiring.  Does not need a refill for Xanax.  Denies suicidal ideation/hi   Hyperlipidemia: Pt stopped taking rosuvastatin  about 2 months ago, reporting intolerances such as diffuse body aches.  Her symptoms resolved after stopping her statin.  She was also taking Meloxicam, but has not needed to take it.   Weight management: Pt reports struggling with weight loss despite her best efforts.  Healthy diet and regular exercise.  Endorses sweet cravings.  Pt is interested in potential options of wt loss injectables.    Past Medical History:  Diagnosis Date   Anxiety    Chicken pox    GERD (gastroesophageal reflux disease)    Hyperlipidemia    Panic disorder      Social History   Tobacco Use   Smoking status: Never   Smokeless tobacco: Never  Vaping Use   Vaping status: Never Used  Substance Use Topics   Alcohol use: Never   Drug use: Never    Past Surgical History:  Procedure Laterality Date   APPENDECTOMY     CARPAL TUNNEL RELEASE Right 01/28/2014   Procedure: RIGHT CARPAL TUNNEL RELEASE;  Surgeon: Amelie Baize, MD;  Location: [facility];  Service: Orthopedics;  Laterality: Right;   LAPAROSCOPIC APPENDECTOMY N/A 10/30/2013   Procedure: APPENDECTOMY LAPAROSCOPIC;  Surgeon: Quitman Bucy, MD;  Location: [facility];  Service: General;  Laterality: N/A;   TONSILLECTOMY     WISDOM TOOTH EXTRACTION  age 82    Family History  Problem Relation Age of Onset   Hyperlipidemia Mother    Anxiety disorder Mother    Heart disease Father    GER disease Maternal Grandmother    Heart disease Paternal Grandfather    Cancer Neg Hx     Allergies  Allergen Reactions   Crestor  [Rosuvastatin ] Other (See Comments)    Body aches all over   Nickel Itching and Swelling   Sulfa Antibiotics Itching and Rash    Current Medications:   Current Outpatient Medications:    ALPRAZolam (XANAX) 0.25 MG tablet, Take 0.125-0.25 mg by mouth as needed., Disp: , Rfl:    pantoprazole  (PROTONIX ) 40 MG tablet, TAKE 1 TABLET BY MOUTH EVERY DAY, Disp: 90 tablet, Rfl: 1   pravastatin (PRAVACHOL) 10 MG tablet, Take 1 tablet (10 mg total) by mouth daily., Disp: 90 tablet, Rfl: 3   PRESCRIPTION MEDICATION, 1 application . Triple Rosacea Cream Azelaic acid15%/Metronidazole 1%/Ivermectin 1% prescribed by Dermatology, Disp: , Rfl:    tirzepatide (ZEPBOUND) 2.5 MG/0.5ML injection vial, Inject 2.5 mg into the skin once a week., Disp: 2 mL, Rfl: 2   buPROPion  (WELLBUTRIN  XL) 300 MG 24 hr tablet, Take 1 tablet (300 mg total) by mouth daily., Disp: 90 tablet, Rfl: 3   sertraline  (ZOLOFT ) 100  MG tablet, Take 1 tablet (100 mg total) by mouth daily., Disp: 90 tablet, Rfl: 3   Review of Systems:   Negative unless otherwise specified per HPI.  Vitals:   Vitals:   12/29/23 0947  BP: 124/86  Pulse: 74  Temp: 97.7 F (36.5 C)  TempSrc: Temporal  Weight: 155 lb 8 oz (70.5 kg)  Height: 5' 3.5" (1.613 m)     Body mass index is 27.11 kg/m.  Physical Exam:   Physical Exam Vitals and nursing note reviewed.  Constitutional:      General: She is not in acute distress.    Appearance: She is well-developed. She is not ill-appearing or toxic-appearing.  Cardiovascular:     Rate and Rhythm: Normal rate and  regular rhythm.     Pulses: Normal pulses.     Heart sounds: Normal heart sounds, S1 normal and S2 normal.  Pulmonary:     Effort: Pulmonary effort is normal.     Breath sounds: Normal breath sounds.  Skin:    General: Skin is warm and dry.  Neurological:     Mental Status: She is alert.     GCS: GCS eye subscore is 4. GCS verbal subscore is 5. GCS motor subscore is 6.  Psychiatric:        Speech: Speech normal.        Behavior: Behavior normal. Behavior is cooperative.     Assessment and Plan:   1. Mixed hyperlipidemia (Primary) Update lipid panel  Due to myalgias with crestor , will start pravastatin  10 mg daily If myalgias return, I have asked her to reach out, we will likely try every other day dosing or refer to lipid clinic - CBC with Differential/Platelet; Future - Comprehensive metabolic panel with GFR; Future - Lipid panel; Future  2. Anxiety and depression Well controlled Continue Wellbutrin  300 mg xl daily and sertraline  100 mg daily Follow up in 1 year, sooner if concerns  3. Overweight Reviewed risks and benefits/side effect(s) of Zepbound  She is agreeable to this Will send in 2.5 mg weekly vials for her to start  I, Bernita Bristle, acting as a Neurosurgeon for Alexander Iba, [place]., have documented all relevant documentation on the behalf of Alexander Iba, [place], as directed by   while in the presence of Alexander Iba, [place].  I, Alexander Iba, [place], have reviewed all documentation for this visit. The documentation on 12/29/23 for the exam, diagnosis, procedures, and orders are all accurate and complete.  Alexander Iba, PA-C

## 2024-01-02 ENCOUNTER — Encounter: Payer: Self-pay | Admitting: Physician Assistant

## 2024-01-02 MED ORDER — BUPROPION HCL ER (XL) 150 MG PO TB24: 150.0000 mg | ORAL_TABLET | Freq: Every day | ORAL | 1 refills | Status: DC

## 2024-01-12 ENCOUNTER — Other Ambulatory Visit (INDEPENDENT_AMBULATORY_CARE_PROVIDER_SITE_OTHER)

## 2024-01-12 DIAGNOSIS — E782 Mixed hyperlipidemia: Secondary | ICD-10-CM

## 2024-01-12 LAB — CBC WITH DIFFERENTIAL/PLATELET
Basophils Absolute: 0.1 10*3/uL (ref 0.0–0.1)
Basophils Relative: 1 % (ref 0.0–3.0)
Eosinophils Absolute: 0.2 10*3/uL (ref 0.0–0.7)
Eosinophils Relative: 2.7 % (ref 0.0–5.0)
HCT: 43.9 % (ref 36.0–46.0)
Hemoglobin: 14.7 g/dL (ref 12.0–15.0)
Lymphocytes Relative: 17.9 % (ref 12.0–46.0)
Lymphs Abs: 1.3 10*3/uL (ref 0.7–4.0)
MCHC: 33.4 g/dL (ref 30.0–36.0)
MCV: 87.4 fl (ref 78.0–100.0)
Monocytes Absolute: 0.7 10*3/uL (ref 0.1–1.0)
Monocytes Relative: 9.1 % (ref 3.0–12.0)
Neutro Abs: 5 10*3/uL (ref 1.4–7.7)
Neutrophils Relative %: 69.3 % (ref 43.0–77.0)
Platelets: 242 10*3/uL (ref 150.0–400.0)
RBC: 5.02 Mil/uL (ref 3.87–5.11)
RDW: 13.2 % (ref 11.5–15.5)
WBC: 7.3 10*3/uL (ref 4.0–10.5)

## 2024-01-12 LAB — COMPREHENSIVE METABOLIC PANEL WITH GFR
ALT: 20 U/L (ref 0–35)
AST: 20 U/L (ref 0–37)
Albumin: 4.6 g/dL (ref 3.5–5.2)
Alkaline Phosphatase: 74 U/L (ref 39–117)
BUN: 19 mg/dL (ref 6–23)
CO2: 27 meq/L (ref 19–32)
Calcium: 9.9 mg/dL (ref 8.4–10.5)
Chloride: 105 meq/L (ref 96–112)
Creatinine, Ser: 0.88 mg/dL (ref 0.40–1.20)
GFR: 66.91 mL/min (ref >60.00)
Glucose, Bld: 82 mg/dL (ref 70–99)
Potassium: 3.7 meq/L (ref 3.5–5.1)
Sodium: 141 meq/L (ref 135–145)
Total Bilirubin: 1 mg/dL (ref 0.2–1.2)
Total Protein: 7.5 g/dL (ref 6.0–8.3)

## 2024-01-12 LAB — LIPID PANEL
Cholesterol: 261 mg/dL — ABNORMAL HIGH (ref 0–200)
HDL: 35.4 mg/dL — ABNORMAL LOW (ref >39.00)
LDL Cholesterol: 181 mg/dL — ABNORMAL HIGH (ref 0–99)
NonHDL: 225.22
Total CHOL/HDL Ratio: 7
Triglycerides: 221 mg/dL — ABNORMAL HIGH (ref 0.0–149.0)
VLDL: 44.2 mg/dL — ABNORMAL HIGH (ref 0.0–40.0)

## 2024-01-14 ENCOUNTER — Ambulatory Visit: Payer: Self-pay | Admitting: Physician Assistant

## 2024-03-14 ENCOUNTER — Other Ambulatory Visit: Payer: Self-pay | Admitting: Physician Assistant

## 2024-04-26 ENCOUNTER — Other Ambulatory Visit: Payer: Self-pay | Admitting: *Deleted

## 2024-04-26 MED ORDER — PANTOPRAZOLE SODIUM 40 MG PO TBEC: 40.0000 mg | DELAYED_RELEASE_TABLET | Freq: Every day | ORAL | 1 refills | Status: AC

## 2024-05-13 ENCOUNTER — Encounter: Payer: Self-pay | Admitting: Physician Assistant

## 2024-05-13 ENCOUNTER — Ambulatory Visit (INDEPENDENT_AMBULATORY_CARE_PROVIDER_SITE_OTHER): Admitting: Physician Assistant

## 2024-05-13 VITALS — BP 124/82 | HR 72 | Wt 140.0 lb

## 2024-05-13 DIAGNOSIS — E663 Overweight: Secondary | ICD-10-CM | POA: Diagnosis not present

## 2024-05-13 MED ORDER — TIRZEPATIDE-WEIGHT MANAGEMENT 5 MG/0.5ML ~~LOC~~ SOLN: 5.0000 mg | SUBCUTANEOUS | 2 refills | Status: DC

## 2024-05-13 NOTE — Progress Notes (Signed)
 Deanna Peters is a 68 y.o. female here for a follow up of a pre-existing problem.  History of Present Illness:   Chief Complaint  Patient presents with   Weight Management Screening   Discussed the use of AI scribe software for clinical note transcription with the patient, who gave verbal consent to proceed.  History of Present Illness   Deanna Peters is a 68 year old female who presents for follow-up regarding her weight management medication. She is currently taking Zepbound  2.5 mg weekly vials.  She experiences increased energy and participates in activities such as pickleball, attributing this to her medication. She initially had severe nausea and vomiting with the full dose of 2.5 mg, but after temporarily reducing the dose, she gradually increased back to 2.5 mg without further issues. Over the past two weeks, she has not observed significant weight loss. She finds the medication administration process seamless with the online pharmacy and reports no adverse side effects from her current regimen.     Wt Readings from Last 3 Encounters:  05/13/24 140 lb (63.5 kg)  12/29/23 155 lb 8 oz (70.5 kg)  12/21/23 158 lb (71.7 kg)       Past Medical History:  Diagnosis Date   Anxiety    Chicken pox    GERD (gastroesophageal reflux disease)    Hyperlipidemia    Panic disorder      Social History   Tobacco Use   Smoking status: Never   Smokeless tobacco: Never  Vaping Use   Vaping status: Never Used  Substance Use Topics   Alcohol use: Never   Drug use: Never    Past Surgical History:  Procedure Laterality Date   APPENDECTOMY     CARPAL TUNNEL RELEASE Right 01/28/2014   Procedure: RIGHT CARPAL TUNNEL RELEASE;  Surgeon: Lamar Leonor Mickey LULLA, MD;  Location: [facility];  Service: Orthopedics;  Laterality: Right;   LAPAROSCOPIC APPENDECTOMY N/A 10/30/2013   Procedure: APPENDECTOMY LAPAROSCOPIC;  Surgeon: Morene ONEIDA Olives, MD;  Location: [facility];  Service:  General;  Laterality: N/A;   TONSILLECTOMY     WISDOM TOOTH EXTRACTION  age 12    Family History  Problem Relation Age of Onset   Hyperlipidemia Mother    Anxiety disorder Mother    Heart disease Father    GER disease Maternal Grandmother    Heart disease Paternal Grandfather    Cancer Neg Hx     Allergies  Allergen Reactions   Crestor  [Rosuvastatin ] Other (See Comments)    Body aches all over   Nickel Itching and Swelling   Sulfa Antibiotics Itching and Rash    Current Medications:   Current Outpatient Medications:    ALPRAZolam (XANAX) 0.25 MG tablet, Take 0.125-0.25 mg by mouth as needed., Disp: , Rfl:    buPROPion  (WELLBUTRIN  XL) 150 MG 24 hr tablet, Take 1 tablet (150 mg total) by mouth daily., Disp: 90 tablet, Rfl: 1   pantoprazole  (PROTONIX ) 40 MG tablet, Take 1 tablet (40 mg total) by mouth daily., Disp: 90 tablet, Rfl: 1   pravastatin  (PRAVACHOL ) 10 MG tablet, Take 1 tablet (10 mg total) by mouth daily., Disp: 90 tablet, Rfl: 3   PRESCRIPTION MEDICATION, 1 application . Triple Rosacea Cream Azelaic acid15%/Metronidazole 1%/Ivermectin 1% prescribed by Dermatology, Disp: , Rfl:    sertraline  (ZOLOFT ) 100 MG tablet, Take 1 tablet (100 mg total) by mouth daily., Disp: 90 tablet, Rfl: 3   tirzepatide  5 MG/0.5ML injection vial, Inject 5 mg into  the skin once a week., Disp: 2 mL, Rfl: 2   Review of Systems:   Negative unless otherwise specified per HPI.  Vitals:   Vitals:   05/13/24 1305  BP: 124/82  Pulse: 72  SpO2: 96%  Weight: 140 lb (63.5 kg)     Body mass index is 24.41 kg/m.  Physical Exam:   Physical Exam Constitutional:      Appearance: Normal appearance. She is well-developed.  HENT:     Head: Normocephalic and atraumatic.  Eyes:     General: Lids are normal.     Extraocular Movements: Extraocular movements intact.     Conjunctiva/sclera: Conjunctivae normal.  Pulmonary:     Effort: Pulmonary effort is normal.  Musculoskeletal:         General: Normal range of motion.     Cervical back: Normal range of motion and neck supple.  Skin:    General: Skin is warm and dry.  Neurological:     Mental Status: She is alert and oriented to person, place, and time.  Psychiatric:        Attention and Perception: Attention and perception normal.        Mood and Affect: Mood normal.        Behavior: Behavior normal.        Thought Content: Thought content normal.        Judgment: Judgment normal.     Assessment and Plan:   Assessment and Plan    History of overweight Obesity management ongoing with positive results. Improved energy and activity on current medication dose. Previous adverse reaction to higher dose resolved with adjustment. Weight loss plateaued. - Increase Zepbound  to 5 mg weekly - Monitor weight and adjust medication as needed. - Consider transitioning to oral medication when available, potentially by January. - Discussed potential side effects of medication and management of dosing.   Lucie Buttner, PA-C

## 2024-05-24 ENCOUNTER — Other Ambulatory Visit: Payer: Self-pay | Admitting: Physician Assistant

## 2024-06-24 ENCOUNTER — Other Ambulatory Visit: Payer: Self-pay | Admitting: Physician Assistant

## 2024-08-07 ENCOUNTER — Other Ambulatory Visit: Payer: Self-pay | Admitting: Physician Assistant

## 2024-08-07 NOTE — Telephone Encounter (Signed)
 Copied from CRM [id number]. Topic: Clinical - Medication Refill >> Aug 07, 2024  3:47 PM Wess RAMAN wrote: Medication: tirzepatide  5 MG/0.5ML injection vial   Has the patient contacted their pharmacy? Yes (Agent: If no, request that the patient contact the pharmacy for the refill. If patient does not wish to contact the pharmacy document the reason why and proceed with request.) (Agent: If yes, when and what did the pharmacy advise?)  This is the patient's preferred pharmacy:    [facility] [place], [place] - [phone] [place] [id number] Phone: [phone] Fax: [phone]  Is this the correct pharmacy for this prescription? Yes If no, delete pharmacy and type the correct one.   Has the prescription been filled recently? Yes  Is the patient out of the medication? No  Has the patient been seen for an appointment in the last year OR does the patient have an upcoming appointment? Yes  Can we respond through MyChart? Yes  Agent: Please be advised that Rx refills may take up to 3 business days. We ask that you follow-up with your pharmacy.

## 2024-09-04 ENCOUNTER — Emergency Department (HOSPITAL_BASED_OUTPATIENT_CLINIC_OR_DEPARTMENT_OTHER): Admitting: Radiology

## 2024-09-04 ENCOUNTER — Encounter (HOSPITAL_BASED_OUTPATIENT_CLINIC_OR_DEPARTMENT_OTHER): Payer: Self-pay | Admitting: *Deleted

## 2024-09-04 ENCOUNTER — Emergency Department (HOSPITAL_BASED_OUTPATIENT_CLINIC_OR_DEPARTMENT_OTHER)
Admission: EM | Admit: 2024-09-04 | Discharge: 2024-09-04 | Disposition: A | Discharge status: Home / Self Care | Attending: Emergency Medicine | Admitting: Emergency Medicine

## 2024-09-04 ENCOUNTER — Other Ambulatory Visit (HOSPITAL_BASED_OUTPATIENT_CLINIC_OR_DEPARTMENT_OTHER): Payer: Self-pay

## 2024-09-04 ENCOUNTER — Other Ambulatory Visit: Payer: Self-pay

## 2024-09-04 DIAGNOSIS — W19XXXA Unspecified fall, initial encounter: Secondary | ICD-10-CM

## 2024-09-04 DIAGNOSIS — W000XXA Fall on same level due to ice and snow, initial encounter: Secondary | ICD-10-CM | POA: Insufficient documentation

## 2024-09-04 DIAGNOSIS — M25511 Pain in right shoulder: Secondary | ICD-10-CM | POA: Insufficient documentation

## 2024-09-04 MED ORDER — NAPROXEN 500 MG PO TABS
500.0000 mg | ORAL_TABLET | Freq: Two times a day (BID) | ORAL | 0 refills | Status: AC
  Filled 2024-09-04: qty 30, 15d supply, fill #0

## 2024-09-04 MED ORDER — NAPROXEN 250 MG PO TABS
500.0000 mg | ORAL_TABLET | Freq: Once | ORAL | Status: AC
  Administered 2024-09-04: 500 mg via ORAL
  Filled 2024-09-04: qty 2

## 2024-09-04 NOTE — ED Notes (Signed)
 DC paperwork given and verbally understood.

## 2024-09-04 NOTE — ED Triage Notes (Signed)
 Pt slipped and fell on ice Friday and has pain in right shoulder.  Pt did not hit her head

## 2024-09-04 NOTE — ED Provider Notes (Signed)
 " [place] EMERGENCY DEPARTMENT AT [facility] Provider Note   CSN: [id number] Arrival date & time: 09/04/24  1110     Patient presents with: Deanna Peters is a 69 y.o. female.   HPI   69 year old female presenting to the emerged apartment with right shoulder pain after a slip and fall on ice on Friday.  The patient initially had minimal pain and felt that symptoms were improving however she pulled on a rug and felt sharp pain radiating from her bicep up to her shoulder and clavicle region.  Pain is worse with range of motion.  She denies any head trauma or loss of consciousness.  She denies any other injuries or complaints.  Pain is about 8 out of 10 in severity when she ranges the shoulder.  She is not taking any medication for pain.  Is not on anticoagulation.  She denies GCS 15, ABC intact.  Prior to Admission medications  Medication Sig Start Date End Date Taking? Authorizing Provider  naproxen  (NAPROSYN ) 500 MG tablet Take 1 tablet (500 mg total) by mouth 2 (two) times daily. 09/04/24  Yes Jerrol Agent, MD  ALPRAZolam (XANAX) 0.25 MG tablet Take 0.125-0.25 mg by mouth as needed. 08/27/22   [provider]  buPROPion  (WELLBUTRIN  XL) 150 MG 24 hr tablet TAKE 1 TABLET BY MOUTH EVERY DAY 06/24/24   Job Lukes, PA  pantoprazole  (PROTONIX ) 40 MG tablet Take 1 tablet (40 mg total) by mouth daily. 04/26/24   Job Lukes, PA  pravastatin  (PRAVACHOL ) 10 MG tablet Take 1 tablet (10 mg total) by mouth daily. 12/29/23   Job Lukes, PA  PRESCRIPTION MEDICATION 1 application . Triple Rosacea Cream Azelaic acid15%/Metronidazole 1%/Ivermectin 1% prescribed by Dermatology    [provider]  sertraline  (ZOLOFT ) 100 MG tablet Take 1 tablet (100 mg total) by mouth daily. 12/29/23   Job Lukes, PA  ZEPBOUND  5 MG/0.5ML injection vial INJECT 0.5 ML (5 MG) UNDER THE SKIN ONCE WEEKLY (0.5ML= 50 UNITS) 08/08/24   Job Lukes, PA    Allergies: Crestor   [rosuvastatin ], Nickel, and Sulfa antibiotics    Review of Systems  All other systems reviewed and are negative.   Updated Vital Signs BP 118/85 (BP Location: Right Arm)   Pulse 86   Temp 98 F (36.7 C) (Oral)   Resp 14   LMP 11/29/2005   SpO2 97%   Physical Exam Vitals and nursing note reviewed.  Constitutional:      General: She is not in acute distress.    Appearance: She is well-developed.     Comments: GCS 15, ABC intact  HENT:     Head: Normocephalic and atraumatic.  Eyes:     Extraocular Movements: Extraocular movements intact.     Conjunctiva/sclera: Conjunctivae normal.     Pupils: Pupils are equal, round, and reactive to light.  Neck:     Comments: No midline tenderness to palpation of the cervical spine.  Range of motion intact Cardiovascular:     Rate and Rhythm: Normal rate and regular rhythm.     Heart sounds: No murmur heard. Pulmonary:     Effort: Pulmonary effort is normal. No respiratory distress.     Breath sounds: Normal breath sounds.  Chest:     Comments: Clavicles stable nontender to AP compression.  Chest wall stable and nontender to AP and lateral compression. Abdominal:     Palpations: Abdomen is soft.     Tenderness: There is no abdominal tenderness.  Comments: Pelvis stable to lateral compression  Musculoskeletal:     Cervical back: Neck supple.     Comments: No midline tenderness to palpation of the thoracic or lumbar spine.  Extremities atraumatic with intact range of motion with the exception of mild tenderness about the right biceps tendon, right shoulder, no AC joint tenderness, no clavicular tenderness.  Distal 2+ radial pulse intact, intact motor function along the median, ulnar, radial nerve distributions  Skin:    General: Skin is warm and dry.  Neurological:     Mental Status: She is alert.     Comments: Cranial nerves II through XII grossly intact.  Moving all 4 extremities spontaneously.  Sensation grossly intact all 4  extremities     (all labs ordered are listed, but only abnormal results are displayed) Labs Reviewed - No data to display  EKG: None  Radiology: DG Shoulder Right Result Date: 09/04/2024 CLINICAL DATA:  Right shoulder pain after fall EXAM: RIGHT SHOULDER - 2+ VIEW COMPARISON:  None Available. FINDINGS: There is no evidence of fracture or dislocation. Minimal degenerative changes seen involving right acromioclavicular joint. Soft tissues are unremarkable. IMPRESSION: Minimal degenerative joint disease of right acromioclavicular joint. No acute abnormality seen. Electronically Signed   By: Lynwood Landy Raddle M.D.   On: 09/04/2024 12:02     Procedures   Medications Ordered in the ED  naproxen  (NAPROSYN ) tablet 500 mg (500 mg Oral Given 09/04/24 1208)                                    Medical Decision Making Amount and/or Complexity of Data Reviewed Radiology: ordered.  Risk Prescription drug management.    69 year old female presenting to the emerged apartment with right shoulder pain after a slip and fall on ice on Friday.  The patient initially had minimal pain and felt that symptoms were improving however she pulled on a rug and felt sharp pain radiating from her bicep up to her shoulder and clavicle region.  Pain is worse with range of motion.  She denies any head trauma or loss of consciousness.  She denies any other injuries or complaints.  Pain is about 8 out of 10 in severity when she ranges the shoulder.  She is not taking any medication for pain.  Is not on anticoagulation.  She denies GCS 15, ABC intact.  On arrival, the patient was vitally stable.  Physical exam generally unremarkable with the exception of tenderness about the right biceps tendon, tenderness about the right shoulder, no AC joint tenderness, no clavicular tenderness, range of motion passively of the right shoulder is intact.  Distally neurovascularly testing in the right upper extremity.  Plan to obtain x-ray  imaging of the right shoulder in addition we will provide naproxen  for pain control.  XR Right Shoulder: IMPRESSION:  Minimal degenerative joint disease of right acromioclavicular joint.  No acute abnormality seen.   The patient was placed in a sling for comfort.  She was advised outpatient follow-up with sports medicine/orthopedics and rest, ice for the next 24 hours, elevation as needed of the extremity, support with sling, NSAIDs for pain control.  No other sign of injury on primary secondary survey, plan for close outpatient follow-up with sports medicine.      Final diagnoses:  Fall, initial encounter  Acute pain of right shoulder    ED Discharge Orders  Ordered    AMB referral to sports medicine        09/04/24 1211    naproxen  (NAPROSYN ) 500 MG tablet  2 times daily        09/04/24 1211               Jerrol Agent, MD 09/04/24 1211  "

## 2024-09-04 NOTE — Discharge Instructions (Signed)
 Your x-ray imaging was negative for acute fracture or dislocation.  A sling has been provided for comfort.  Recommend rest, ice for the next 24 hours, sling as needed for elevation, NSAIDs for pain control and outpatient follow-up with sports medicine within the next week for repeat assessment.

## 2024-09-05 ENCOUNTER — Telehealth: Payer: Self-pay

## 2024-09-05 NOTE — Telephone Encounter (Signed)
 Transition Care Management Follow-up Telephone Call Date of discharge and from where: 09/04/2024- [facility]  How have you been since you were released from the hospital? NO Any questions or concerns? No  Items Reviewed: Did the pt receive and understand the discharge instructions provided? Yes  Medications obtained and verified? Yes  Any new allergies since your discharge? No  Dietary orders reviewed? Yes Do you have support at home? Yes   Home Care and Equipment/Supplies: Were home health services ordered? not applicable Has the agency set up a time to come to the patient's home? not applicable Were any new equipment or medical supplies ordered?  No Were you able to get the supplies/equipment? not applicable Do you have any questions related to the use of the equipment or supplies? No  Functional Questionnaire: (I = Independent and D = Dependent) ADLs: I  Bathing/Dressing- I  Meal Prep- I  Eating- I  Maintaining continence- I  Transferring/Ambulation- I  Managing Meds- I  Follow up appointments reviewed:  PCP Hospital f/u appt confirmed? No   Specialist Hospital f/u appt confirmed? Yes  Scheduled to see [facility]- Dr. Dozier on 09/06/2024  Are transportation arrangements needed? No  If their condition worsens, is the pt aware to call PCP or go to the Emergency Dept.? Yes Was the patient provided with contact information for the PCP's office or ED? Yes Was to pt encouraged to call back with questions or concerns? Yes
